# Patient Record
Sex: Male | Born: 1935 | Race: Black or African American | Hispanic: No | Marital: Married | Smoking: Current every day smoker
Health system: Southern US, Community
[De-identification: ages and names within clinical notes are randomized; demographics above are authoritative.]

## PROBLEM LIST (undated history)

## (undated) DIAGNOSIS — K602 Anal fissure, unspecified: Secondary | ICD-10-CM

## (undated) DIAGNOSIS — I10 Essential (primary) hypertension: Secondary | ICD-10-CM

## (undated) DIAGNOSIS — A048 Other specified bacterial intestinal infections: Secondary | ICD-10-CM

## (undated) DIAGNOSIS — F17200 Nicotine dependence, unspecified, uncomplicated: Secondary | ICD-10-CM

## (undated) DIAGNOSIS — N12 Tubulo-interstitial nephritis, not specified as acute or chronic: Secondary | ICD-10-CM

## (undated) DIAGNOSIS — N400 Enlarged prostate without lower urinary tract symptoms: Secondary | ICD-10-CM

## (undated) DIAGNOSIS — C679 Malignant neoplasm of bladder, unspecified: Secondary | ICD-10-CM

## (undated) DIAGNOSIS — C61 Malignant neoplasm of prostate: Secondary | ICD-10-CM

## (undated) DIAGNOSIS — K253 Acute gastric ulcer without hemorrhage or perforation: Secondary | ICD-10-CM

## (undated) DIAGNOSIS — K429 Umbilical hernia without obstruction or gangrene: Secondary | ICD-10-CM

## (undated) HISTORY — DX: Umbilical hernia without obstruction or gangrene: K42.9

## (undated) HISTORY — PX: BLADDER NECK RECONSTRUCTION: SHX1239

## (undated) HISTORY — DX: Tubulo-interstitial nephritis, not specified as acute or chronic: N12

## (undated) HISTORY — DX: Acute gastric ulcer without hemorrhage or perforation: K25.3

## (undated) HISTORY — DX: Anal fissure, unspecified: K60.2

## (undated) HISTORY — DX: Nicotine dependence, unspecified, uncomplicated: F17.200

## (undated) HISTORY — PX: PROSTATE SURGERY: SHX751

## (undated) HISTORY — DX: Other specified bacterial intestinal infections: A04.8

## (undated) HISTORY — DX: Essential (primary) hypertension: I10

## (undated) HISTORY — DX: Benign prostatic hyperplasia without lower urinary tract symptoms: N40.0

## (undated) HISTORY — PX: URETHRAL DILATION: SUR417

---

## 2008-10-27 ENCOUNTER — Emergency Department (HOSPITAL_COMMUNITY): Admission: EM | Admit: 2008-10-27 | Discharge: 2008-10-27 | Payer: Self-pay | Admitting: Emergency Medicine

## 2009-02-15 ENCOUNTER — Emergency Department (HOSPITAL_COMMUNITY): Admission: EM | Admit: 2009-02-15 | Discharge: 2009-02-15 | Payer: Self-pay | Admitting: Family Medicine

## 2009-04-16 ENCOUNTER — Emergency Department (HOSPITAL_COMMUNITY): Admission: EM | Admit: 2009-04-16 | Discharge: 2009-04-16 | Payer: Self-pay | Admitting: Family Medicine

## 2009-05-21 ENCOUNTER — Encounter: Payer: Self-pay | Admitting: Family Medicine

## 2009-05-21 ENCOUNTER — Ambulatory Visit: Payer: Self-pay | Admitting: Family Medicine

## 2009-05-21 DIAGNOSIS — K253 Acute gastric ulcer without hemorrhage or perforation: Secondary | ICD-10-CM

## 2009-05-21 DIAGNOSIS — I10 Essential (primary) hypertension: Secondary | ICD-10-CM

## 2009-05-21 DIAGNOSIS — F17200 Nicotine dependence, unspecified, uncomplicated: Secondary | ICD-10-CM

## 2009-05-21 HISTORY — DX: Essential (primary) hypertension: I10

## 2009-05-21 HISTORY — DX: Acute gastric ulcer without hemorrhage or perforation: K25.3

## 2009-05-21 HISTORY — DX: Nicotine dependence, unspecified, uncomplicated: F17.200

## 2009-05-21 LAB — CONVERTED CEMR LAB
BUN: 25 mg/dL — ABNORMAL HIGH (ref 6–23)
CO2: 27 meq/L (ref 19–32)
Chloride: 100 meq/L (ref 96–112)
Creatinine, Ser: 1.31 mg/dL (ref 0.40–1.50)
H Pylori IgG: POSITIVE
HCT: 46.5 % (ref 39.0–52.0)
HDL: 26 mg/dL — ABNORMAL LOW (ref >39)
Hemoglobin: 16.3 g/dL (ref 13.0–17.0)
LDL Cholesterol: 66 mg/dL (ref 0–99)
MCV: 94.5 fL (ref 78.0–100.0)
Potassium: 3.5 meq/L (ref 3.5–5.3)
RBC: 4.92 M/uL (ref 4.22–5.81)
RDW: 13.1 % (ref 11.5–15.5)
Total CHOL/HDL Ratio: 5.3
VLDL: 45 mg/dL — ABNORMAL HIGH (ref 0–40)
WBC: 5.5 10*3/uL (ref 4.0–10.5)

## 2009-06-01 ENCOUNTER — Encounter: Payer: Self-pay | Admitting: Family Medicine

## 2009-06-04 ENCOUNTER — Encounter (HOSPITAL_COMMUNITY): Admission: RE | Admit: 2009-06-04 | Discharge: 2009-08-23 | Payer: Self-pay

## 2009-06-05 ENCOUNTER — Emergency Department (HOSPITAL_COMMUNITY): Admission: EM | Admit: 2009-06-05 | Discharge: 2009-06-05 | Payer: Self-pay | Admitting: Emergency Medicine

## 2009-06-06 ENCOUNTER — Emergency Department (HOSPITAL_COMMUNITY): Admission: EM | Admit: 2009-06-06 | Discharge: 2009-06-06 | Payer: Self-pay | Admitting: Emergency Medicine

## 2009-06-10 ENCOUNTER — Ambulatory Visit: Payer: Self-pay | Admitting: Family Medicine

## 2009-06-10 ENCOUNTER — Ambulatory Visit (HOSPITAL_COMMUNITY): Admission: RE | Admit: 2009-06-10 | Discharge: 2009-06-10 | Payer: Self-pay | Admitting: Family Medicine

## 2009-06-10 DIAGNOSIS — R1013 Epigastric pain: Secondary | ICD-10-CM

## 2009-06-10 DIAGNOSIS — R1310 Dysphagia, unspecified: Secondary | ICD-10-CM | POA: Insufficient documentation

## 2009-06-14 ENCOUNTER — Ambulatory Visit: Payer: Self-pay | Admitting: Family Medicine

## 2009-06-14 ENCOUNTER — Encounter: Payer: Self-pay | Admitting: Family Medicine

## 2009-06-14 DIAGNOSIS — A048 Other specified bacterial intestinal infections: Secondary | ICD-10-CM | POA: Insufficient documentation

## 2009-06-14 HISTORY — DX: Other specified bacterial intestinal infections: A04.8

## 2009-07-02 ENCOUNTER — Ambulatory Visit: Payer: Self-pay | Admitting: Family Medicine

## 2009-11-30 ENCOUNTER — Encounter: Payer: Self-pay | Admitting: Family Medicine

## 2009-11-30 ENCOUNTER — Ambulatory Visit: Payer: Self-pay | Admitting: Family Medicine

## 2009-11-30 LAB — CONVERTED CEMR LAB
AST: 17 units/L (ref 0–37)
Alkaline Phosphatase: 61 units/L (ref 39–117)
Basophils Absolute: 0 10*3/uL (ref 0.0–0.1)
Basophils Relative: 0 % (ref 0–1)
CO2: 24 meq/L (ref 19–32)
Eosinophils Absolute: 0.1 10*3/uL (ref 0.0–0.7)
Eosinophils Relative: 3 % (ref 0–5)
Glucose, Bld: 112 mg/dL — ABNORMAL HIGH (ref 70–99)
Glucose, Urine, Semiquant: NEGATIVE
Hemoglobin: 16.1 g/dL (ref 13.0–17.0)
Lymphocytes Relative: 47 % — ABNORMAL HIGH (ref 12–46)
Lymphs Abs: 2.1 10*3/uL (ref 0.7–4.0)
Monocytes Absolute: 0.5 10*3/uL (ref 0.1–1.0)
Monocytes Relative: 11 % (ref 3–12)
Neutrophils Relative %: 39 % — ABNORMAL LOW (ref 43–77)
Protein, U semiquant: 30
RDW: 13.5 % (ref 11.5–15.5)
Specific Gravity, Urine: 1.025
Total Protein: 7.2 g/dL (ref 6.0–8.3)

## 2009-12-01 ENCOUNTER — Ambulatory Visit (HOSPITAL_COMMUNITY): Admission: RE | Admit: 2009-12-01 | Discharge: 2009-12-01 | Payer: Self-pay | Admitting: Family Medicine

## 2009-12-21 ENCOUNTER — Ambulatory Visit: Payer: Self-pay | Admitting: Family Medicine

## 2009-12-21 DIAGNOSIS — N400 Enlarged prostate without lower urinary tract symptoms: Secondary | ICD-10-CM

## 2009-12-21 DIAGNOSIS — H538 Other visual disturbances: Secondary | ICD-10-CM

## 2009-12-21 HISTORY — DX: Benign prostatic hyperplasia without lower urinary tract symptoms: N40.0

## 2010-03-29 ENCOUNTER — Encounter: Payer: Self-pay | Admitting: Family Medicine

## 2010-06-14 NOTE — Assessment & Plan Note (Signed)
Summary: f/up,tcb   Vital Signs:  Patient profile:   77 year old male Weight:      181.7 pounds Temp:     98.5 degrees F oral Pulse rate:   68 / minute BP sitting:   118 / 71  (left arm) Cuff size:   regular  Vitals Entered By: Garen Grams LPN (December 21, 2009 4:05 PM) CC: f/u last visit Is Patient Diabetic? No Pain Assessment Patient in pain? no        Primary Care Provider:  Antoine Primas  CC:  f/u last visit.  History of Present Illness: 77 yo male with hx of BPH s/p turp here for f/u for recent UTI.  Pt was seen recently at [facility] where he had some type of dilatation for his urethra.  Pt was placed on macrobid and finished a course approx a week ago. Pt denies any pain with urination no increase frequency or urgency.  Pt feels much more like himself.  Pt did not get the cardura because it was too expensive.   Pt does report some constipation recently, but not an acute change pt did try colace but only took it for 1-2 days and did not feel it made a difference. No change in stool color. no change in frequency of consistency, just something that could be improved.   blurred visoin-  Pt has hx of cataract surgery of left eye, blind right eye. Pt staes recently been having more prblem with seeing, has glasses but even with glasees vision is not as clear as it used to be. No loss of vision, color is still the same. Pt has not had an eye exam for quite some time.    Still smoking does not want to quit.  Pt recently had a CT scan of the lungs as well and found a nodule approx 5mm that will need to be followed with CT scan in 6-12 months.     Habits & Providers  Alcohol-Tobacco-Diet     Tobacco Status: never  Allergies: No Known Drug Allergies  Social History: Smoking Status:  never   Impression & Recommendations:  Problem # 1:  DYSURIA (ICD-788.1) Assessment Improved resolved.   Did not test for cure, will not do ppx treatment at this time but would not hesitate  treating with macrobid again with likely ppx treatment at that time. Will continue to be followed by [facility].  WOuld consider getting PSA next visit.  The following medications were removed from the medication list:    Nitrofurantoin Macrocrystal 100 Mg Caps (Nitrofurantoin macrocrystal) .Marland Kitchen... 1 capsule by mouth 4 times a day for 14 days.  take with food.  Problem # 2:  BLURRED VISION (ICD-368.8) will refer to optho.  Looks like reaccumulation of pt cataracts.  Pt had cataract surgery previously. Pt though has decent sight in that eye.  Blind in right eye.  Will check pt blood sugar at next visit.  Orders: [facility]- Est  Level 4 (40981) Ophthalmology Referral (Ophthalmology)  Problem # 3:  TOBACCO ABUSE (ICD-305.1) Pt still smoking, does have a nodule in lung that needs tobe monitored, due for repaet CT scan after 1/12.  Pt once again knows of consequence of continuing smoking.  Still declines help.  Orders: [facility]- Est  Level 4 (19147)  Problem # 4:  COnstipation WIll try colace and miralax, monitor for improvement.   Complete Medication List: 1)  Hydrochlorothiazide 25 Mg Tabs (Hydrochlorothiazide) .... Take 1 tab daily 2)  Colace 100  Mg Caps (Docusate sodium) .... Take 1 cap by mouth two times a day for constipation 3)  Miralax Powd (Polyethylene glycol 3350) .... Take 1 capful by mouth two times a day 4)  Doxazosin Mesylate 2 Mg Tabs (Doxazosin mesylate) .... Take 1 tab by mouth once daily  Patient Instructions: 1)  good to see you 2)  I am giving you a prescription for the medicine that should help his prostate.  Take 1 pill daily.  Go to any [facility] and can get a 3 month supply for 10 dollars 3)  Try miralax and colace for constipation.  Take 1-2 times daily hold for loose stool 4)  If you begin to get fever, pain with urination or increse frequency or urgency you need to be seen again.  5)  I want to see you again in 4 weeks. Prescriptions: DOXAZOSIN MESYLATE 2 MG TABS (DOXAZOSIN MESYLATE)  take 1 tab by mouth once daily  #90 x 3   Entered and Authorized by:   Antoine Primas DO   Signed by:   Antoine Primas DO on 12/21/2009   Method used:   Print then Give to Patient   RxID:   [id number] MIRALAX  POWD (POLYETHYLENE GLYCOL 3350) take 1 capful by mouth two times a day  #1 bottle x 3   Entered and Authorized by:   Antoine Primas DO   Signed by:   Antoine Primas DO on 12/21/2009   Method used:   Faxed to ...       [facility] (retail)       [address] [place], [place]  [address]       Ph: [phone]       Fax: [phone]   RxID:   [id number] COLACE 100 MG CAPS (DOCUSATE SODIUM) take 1 cap by mouth two times a day for constipation  #60 x 3   Entered and Authorized by:   Antoine Primas DO   Signed by:   Antoine Primas DO on 12/21/2009   Method used:   Faxed to ...       [facility] Department (retail)       [address] [place], [place]  [address]       Ph: [phone]       Fax: [phone]   RxID:   [id number]

## 2010-06-14 NOTE — Miscellaneous (Signed)
  Clinical Lists Changes  Problems: Removed problem of DYSURIA (ICD-788.1) Removed problem of FREQUENCY, URINARY (ICD-788.41) Removed problem of ABDOMINAL PAIN (ICD-789.00)

## 2010-06-14 NOTE — Assessment & Plan Note (Signed)
Summary: CHEST  PAIN TROUBLE SWALLOWING/LS   Vital Signs:  Patient profile:   77 year old male Weight:      140 pounds Pulse rate:   80 / minute BP sitting:   140 / 80  (left arm)  Vitals Entered By: Theresia Lo RN (June 10, 2009 1:52 PM) CC: CHESTB PAIN, TROUBLE SWALLOWING Is Patient Diabetic? No   Primary Care Provider:  Antoine Primas  CC:  CHESTB PAIN and TROUBLE SWALLOWING.  History of Present Illness: work in visit for epigastric pain and trouble swallowing x 3 days.  Recently (1 week ago) d/c'd from [facility] for a prostate surgery.  He had been in the hospital for three days and still had a foley catheter in place.  3 days ago, pt started to have sharp and intermittent epigastric pain.  This pain does not radiate.  He has some pain in his lower back that followed the same timecourse.  He denies any feeling of choaking or coughing with liquids.  He also began to have pain with drinking liquids and felt that he had trouble with swallowing solids.  For the last two days, he has not had an appetite and has not been eating much.    He denies fever or weightloss.  no vomitting.  He has been diagnosed with GERD and was started on a PPI.  However, he is not sure if he recieved this medicine while in the hospital.  He has not tried any medication for this pain.    Habits & Providers  Alcohol-Tobacco-Diet     Tobacco Status: current     Tobacco Counseling: to quit use of tobacco products     Cigarette Packs/Day: 2.0  Current Medications (verified): 1)  Hydrochlorothiazide 25 Mg Tabs (Hydrochlorothiazide) .... Take 1 Tab Daily 2)  Nicorette 4 Mg Gum (Nicotine Polacrilex) .... Chew One Piece of Gum At A Time.  Up To 10 Pieces Daily For First 6 Weeks 3)  Omeprazole 40 Mg Cpdr (Omeprazole) .... Take 1 Pill Two Times A Day  Allergies (verified): No Known Drug Allergies  Social History: Smoking Status:  current Packs/Day:  2.0  Review of Systems       The patient complains of  anorexia, prolonged cough, and abdominal pain.  The patient denies fever, weight loss, chest pain, melena, and hematochezia.    Physical Exam  General:  Well-developed,well-nourished,in no acute distress; alert,appropriate and cooperative throughout examination Mouth:  fair dentitionpharynx pink and moist and no lesions.   Neck:  supple, full ROM, no thyromegaly, no thyroid nodules or tenderness, and no neck tenderness.   Lungs:  Normal respiratory effort, chest expands symmetrically. Lungs are clear to auscultation, no crackles or wheezes. Heart:  Normal rate and regular rhythm. S1 and S2 normal without gallop, murmur, click, rub or other extra sounds. Abdomen:  no distention, no masses, no guarding, abdominal scar(s), bowel sounds hyperactive, and epigastric tenderness.  and periumbilical tenderness Rectal:  No external abnormalities noted. Normal sphincter tone. No rectal masses or tenderness.  stool negative for occult blood Cervical Nodes:  no nodes appreciated   Impression & Recommendations:  Problem # 1:  EPIGASTRIC PAIN (ICD-789.06) Assessment New originally reported as chest pain.   Got EKG, which I reviewed with Dr. McDiarmid, and was not concerned for acute MI.  With further hx, appears to be epigastric not chest.  Sounds like possibly ulcer or gastritis.  Has hx of GERD. negative FOB.   Felt some relief with GI cocktail.  Recommended that pt take prilosec 40 mg two times a day instead of once daily.  Told pt to follow up on Monday.  WIll consider GI referral for EGD if unrelieved by increased PPI.   Orders: [facility]- Est  Level 4 ([address])  Problem # 2:  DYSPHAGIA UNSPECIFIED (ICD-787.20) Assessment: New trouble and pain with liquids and solids. possibly esphagitis vs. stricture.  stricture less likely with this sudden onset.   Plan as above.  recommended Pt take ensure while uncomfortable with swallowing. Orders: [facility]- Est  Level 4 (60454)  Complete Medication List: 1)   Hydrochlorothiazide 25 Mg Tabs (Hydrochlorothiazide) .... Take 1 tab daily 2)  Nicorette 4 Mg Gum (Nicotine polacrilex) .... Chew one piece of gum at a time.  up to 10 pieces daily for first 6 weeks 3)  Omeprazole 40 Mg Cpdr (Omeprazole) .... Take 1 pill two times a day  Other Orders: EMR miscellaneous medications [facility])  Patient Instructions: 1)  please come back on Monday.  Make an appt at the desk 2)  I want you to take your acid reducing medicine twice a day.   3)  If you aren't eating, please drink ensure to get some calories until you feel better.   4)  Please call the clinic if you have blood in your stool, if you are completely unable to swallow, or with any concerns.  Prescriptions: OMEPRAZOLE 40 MG CPDR (OMEPRAZOLE) Take 1 pill two times a day  #60 x 2   Entered and Authorized by:   Ellery Plunk MD   Signed by:   Ellery Plunk MD on 06/10/2009   Method used:   Print then Give to Patient   RxID:   [id number]    Vital Signs:  Patient profile:   77 year old male Weight:      140 pounds Pulse rate:   80 / minute BP sitting:   140 / 80  (left arm)  Vitals Entered By: Theresia Lo RN (June 10, 2009 1:52 PM)   Medication Administration  Medication # 1:    Medication: EMR miscellaneous medications    Diagnosis: ACUT GASTR ULCER W/O MENTION HEMORR PERF/OBST (ICD-531.30)    Dose: 30ml    Route: po    Exp Date: 11/01/2009    Lot #: [id number]    Comments: GI cocktail    Patient tolerated medication without complications    Given by: Jone Baseman CMA (June 10, 2009 2:47 PM)  Orders Added: 1)  EMR miscellaneous medications [EMRORAL] 2)  [facility]- Est  Level 4 [69629]

## 2010-06-14 NOTE — Assessment & Plan Note (Signed)
Summary: fu/kh   Vital Signs:  Patient profile:   77 year old male Weight:      183.5 pounds Temp:     99.1 degrees F oral Pulse rate:   103 / minute BP sitting:   127 / 74  (right arm) Cuff size:   regular  Vitals Entered By: Garen Grams LPN (July 02, 2009 2:19 PM) CC: f/u , Hypertension Management Is Patient Diabetic? No Pain Assessment Patient in pain? yes     Location: back   Primary Care Provider:  Antoine Primas  CC:  f/u  and Hypertension Management.  History of Present Illness: Pt state ejaculation problem since surgery.  Pt orgasms but no ejaculation occurs.  Pt states no pain no blood just wants to know whats going on.  Pt informed in the operation note it states that he has to have a vasectomy   HTN well control  Pt is taking his medications well and is very happy with his regimen.   tobacco abuse.  Pt states he is still smoking tried the gum but could not stop.  Not motivated to try to stop at this moment.    Pt was treated for H pylori recently and it helped.  Pt states he has not felt this good in a long time.   Hypertension History:      Positive major cardiovascular risk factors include male age 59 years old or older, hypertension, and current tobacco user.    Current Medications (verified): 1)  Hydrochlorothiazide 25 Mg Tabs (Hydrochlorothiazide) .... Take 1 Tab Daily 2)  Nicorette 4 Mg Gum (Nicotine Polacrilex) .... Chew One Piece of Gum At A Time.  Up To 10 Pieces Daily For First 6 Weeks 3)  Omeprazole 40 Mg Cpdr (Omeprazole) .... Take 1 Pill Two Times A Day  Allergies (verified): No Known Drug Allergies  Past History:  Past Surgical History: proctectomy and bladder repair surgery > 2 years ago.  Reconstructed at [facility] in 05/2009  Review of Systems       denies fever, chills, nausea, vomiting, diarrhea or constipation   Physical Exam  General:  Well-developed,well-nourished,in no acute distress; alert,appropriate and cooperative throughout  examination Lungs:  Normal respiratory effort, chest expands symmetrically. Lungs are clear to auscultation, no crackles or wheezes. Heart:  Normal rate and regular rhythm. S1 and S2 normal without gallop, murmur, click, rub or other extra sounds. Abdomen:  Bowel sounds positive,abdomen soft and non-tender without masses, organomegaly or hernias noted.   Impression & Recommendations:  Problem # 1:  ESSENTIAL HYPERTENSION, BENIGN (ICD-401.1) Assessment Improved well controlled, continue current tx His updated medication list for this problem includes:    Hydrochlorothiazide 25 Mg Tabs (Hydrochlorothiazide) .Marland Kitchen... Take 1 tab daily  Orders: [facility]- Est  Level 4 (36644)  Problem # 2:  DYSPHAGIA UNSPECIFIED (ICD-787.20) tx for H pylori and no longer have any problems  Problem # 3:  TOBACCO ABUSE (ICD-305.1) Assessment: Unchanged not motivated to quit at this time His updated medication list for this problem includes:    Nicorette 4 Mg Gum (Nicotine polacrilex) .Marland Kitchen... Chew one piece of gum at a time.  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 Take 1 tab daily 2)  Nicorette 4 Mg Gum (Nicotine polacrilex) .... Chew one piece of gum at a time.  up to 10 pieces daily for first 6 weeks 3)  Omeprazole 40 Mg Cpdr (Omeprazole) .... Take 1 pill two times a day  Hypertension Assessment/Plan:      The patient's hypertensive risk group is category B: At least one risk factor (excluding diabetes) with no target organ damage.  His calculated 10 year risk of coronary heart disease is 22 %.  Today's blood pressure is 127/74.  His blood pressure goal is < 140/90.   Prevention & Chronic Care Immunizations   Influenza vaccine: Not documented   Influenza vaccine deferral: Deferred  (07/02/2009)    Tetanus booster:  Not documented   Td booster deferral: Not indicated  (07/02/2009)    Pneumococcal vaccine: Not documented    H. zoster vaccine: Not documented  Colorectal Screening   Hemoccult: Not documented    Colonoscopy: Not documented  Other Screening   PSA: Not documented   Smoking status: current  (06/14/2009)  Lipids   Total Cholesterol: 137  (05/21/2009)   LDL: 66  (05/21/2009)   LDL Direct: Not documented   HDL: 26  (05/21/2009)   Triglycerides: 223  (05/21/2009)  Hypertension   Last Blood Pressure: 127 / 74  (07/02/2009)   Serum creatinine: 1.31  (05/21/2009)   Serum potassium 3.5  (05/21/2009)    Hypertension flowsheet reviewed?: Yes   Progress toward BP goal: At goal  Self-Management Support :   Personal Goals (by the next clinic visit) :      Personal blood pressure goal: 140/90  (07/02/2009)   Hypertension self-management support: Written self-care plan  (07/02/2009)   Hypertension self-care plan printed.

## 2010-06-14 NOTE — Assessment & Plan Note (Signed)
Summary: f/u per smith/eo   Vital Signs:  Patient profile:   77 year old male Weight:      188 pounds Temp:     98.4 degrees F Pulse rate:   76 / minute BP sitting:   150 / 76  Vitals Entered By: Jone Baseman CMA (June 14, 2009 1:43 PM) CC: f/u chest discomfort Is Patient Diabetic? No Pain Assessment Patient in pain? no        Primary Care Provider:  Antoine Primas  CC:  f/u chest discomfort.  History of Present Illness: Evaluated for epigastric pain on 01/27 in this clinic.  Relief from GI cocktail, so given Rx for Omeprazole 40 mg two times a day which he says has provided complete relief.  Has h/o peptic ulcer without bleeding.  H Pylori positive.  Also questions why blood pressure is elevated today.  Today denies epigastric and chest pain, nausea, vomiting, diarrhea, constipation, hematochezia, melena, dyspnea.  Habits & Providers  Alcohol-Tobacco-Diet     Tobacco Status: current     Tobacco Counseling: to quit use of tobacco products     Cigarette Packs/Day: 1.0  Allergies: No Known Drug Allergies  Social History: Packs/Day:  1.0  Physical Exam  General:  Well-developed,well-nourished,in no acute distress; alert,appropriate and cooperative throughout examination Lungs:  Normal respiratory effort, chest expands symmetrically. Lungs are clear to auscultation, no crackles or wheezes. Heart:  Normal rate and regular rhythm. S1 and S2 normal without gallop, murmur, click, rub or other extra sounds. Abdomen:  Bowel sounds positive,abdomen soft and non-tender without masses, organomegaly or hernias noted.   Impression & Recommendations:  Problem # 1:  EPIGASTRIC PAIN (ICD-789.06) Assessment Improved  Likely GERD.  Continue PPI two times a day.  Orders: [facility]- Est  Level 4 ([address]109)  Problem # 2:  HELICOBACTER PYLORI INFECTION (ICD-041.86) Assessment: New  Treat with continued PPI + Amoxicillin & Clarithromycin two times a day x2 weeks.  Orders: [facility]-  Est  Level 4 (60454)  Problem # 3:  ESSENTIAL HYPERTENSION, BENIGN (ICD-401.1) Assessment: Deteriorated  Will not adjust meds in light of recent surgery.  If elevated in 1 month, would adjust medications. His updated medication list for this problem includes:    Hydrochlorothiazide 25 Mg Tabs (Hydrochlorothiazide) .Marland Kitchen... Take 1 tab daily  BP today: 150/76 Prior BP: 140/80 (06/10/2009)  Labs Reviewed: K+: 3.5 (05/21/2009) Creat: : 1.31 (05/21/2009)   Chol: 137 (05/21/2009)   HDL: 26 (05/21/2009)   LDL: 66 (05/21/2009)   TG: 223 (05/21/2009)  Orders: [facility]- Est  Level 4 (09811)  Complete Medication List: 1)  Hydrochlorothiazide 25 Mg Tabs (Hydrochlorothiazide) .... Take 1 tab daily 2)  Nicorette 4 Mg Gum (Nicotine polacrilex) .... Chew one piece of gum at a time.  up to 10 pieces daily for first 6 weeks 3)  Omeprazole 40 Mg Cpdr (Omeprazole) .... Take 1 pill two times a day 4)  Clarithromycin 500 Mg Tabs (Clarithromycin) .Marland Kitchen.. 1 tab by mouth two times a day x14 days 5)  Amoxicillin 500 Mg Tabs (Amoxicillin) .... 2 tabs by mouth two times a day x2 weeks  Patient Instructions: 1)  Continue to use the Omeprazole two times a day for your pain. 2)  It may be caused by an infection in your stomach call H Pylori, so I want to give you 2 weeks of antiboitics to take two times a day:  Amoxicillin and Clarithromycin. 3)  Please schedule a follow-up appointment in 1 month with Dr. Katrinka Blazing. Prescriptions: OMEPRAZOLE 40  MG CPDR (OMEPRAZOLE) Take 1 pill two times a day  #60 x 2   Entered and Authorized by:   Romero Belling MD   Signed by:   Romero Belling MD on 06/14/2009   Method used:   Print then Give to Patient   RxID:   [id number] AMOXICILLIN 500 MG TABS (AMOXICILLIN) 2 tabs by mouth two times a day x2 weeks  #28 x 0   Entered and Authorized by:   Romero Belling MD   Signed by:   Romero Belling MD on 06/14/2009   Method used:   Print then Give to Patient   RxID:    [id number] CLARITHROMYCIN 500 MG TABS (CLARITHROMYCIN) 1 tab by mouth two times a day x14 days  #28 x 0   Entered and Authorized by:   Romero Belling MD   Signed by:   Romero Belling MD on 06/14/2009   Method used:   Print then Give to Patient   RxID:   [id number]

## 2010-06-14 NOTE — Miscellaneous (Signed)
Summary: being d/c from [facility]  Clinical Lists Changes spoke with  Olegario Messier at [facility]. [phone] was in for urethral dilataion & other precedures. he is being discharged on IV antibiotics to be done once dailt. they want to set this up to be done in [place]. told her that the md would likely set it up to be done at Short Stay or thru [facility].  last dose would be Sunday 23rd. she is faxing all notes & orders. PCP is not here so will give to preceptor to handle.Golden Circle RN  June 01, 2009 12:07 PM  rec'd the [facility] notes & orders. to preceptor to handle. spoke with SW. he has no insurance.she had not set him up with home care for the daily infusion because then her hospital will have to pay for it. told her he has no insurance coverage here.  she will start working on arranging home care.  she will see that he gets a [facility] & the home care agency gets the orders. will need md here to oversee his care & get results of labs, etc. told her he does have a pcp here.Golden Circle RN  June 01, 2009 4:03 PM

## 2010-06-14 NOTE — Assessment & Plan Note (Signed)
Summary: NEW PT/KH   Primary Care Provider:  Antoine Primas   History of Present Illness: 77 yo new pt recent immagrant of [place] here to establish care with multiple problems.  1.) Dysuria-  Pt is seen by urologist at [facility], Pt has hx of prostectomy which caused a nick in the bladder that needed to be repaired.  Pt now has had dysuuria, increase frequency, foul smelling urine.  Pt was recently put on cipro for UTI and is f/u with [facility] for procedure to help the chronic UTI's  2.)  tobacco abuse.  Pt smoke 2ppd and for multiple decades.  Pt would like to stop.  Has not tried to quit before but he would like to quit for his family  3.)Ulcer.  Pt had a UES done in [place] that diagnosed, never bled though.  Pt states he still has stomach pain off and on but it is better.  He takes zantac which seems to help some.    Never was tested for H pylori.   4. HTN- Has had it many years is on HCTZ.  123/78 today.  Denies headache, visual changes abdominal pain out of the ordinary.    5.) Catarcts.  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  DOes see an optholmologist who states the left eye is still ok and does not need surgery yet.  Pt will f/u with optho.    Current Medications (verified): 1)  Hydrochlorothiazide 25 Mg Tabs (Hydrochlorothiazide) .... Take 1 Tab Daily 2)  Nicorette 4 Mg Gum (Nicotine Polacrilex) .... Chew One Piece of Gum At A Time.  Up To 10 Pieces Daily For First 6 Weeks 3)  Omeprazole 40 Mg Cpdr (Omeprazole) .... Take 1 Pill Daily  Allergies (verified): No Known Drug Allergies  Past History:  Past Medical History: HTN, Ulcer,   Past Surgical History: proctectomy and bladder repair surgery > 2 years ago.    Family History: mother HTN  Social History: Lives with son and 6 grand kids and his wife who also recently immagrated from [place].    Review of Systems       denies fever, chills, nausea, vomiting, diarrhea or constipation cp, sob  Physical Exam  General:  dressed in  traditional arabic clothing, NAD, very friendly.   Head:  Normocephalic and atraumatic without obvious abnormalities. No apparent alopecia or balding. Eyes:  Pt has severe catarct in the gith eye, can see only shadows left eye has mild to mmoderate cataract, errla, eomi Mouth:  multiple filling otherwise in good repair Neck:  No deformities, masses, or tenderness noted. Was told one time that he had a swollen node on the right side of his neck.  Not there now.) Lungs:  decreased airmovement throughout, mild rhonchi, no weezes, still good aeration.    Heart:  Normal rate and regular rhythm. S1 and S2 normal without gallop, murmur, click, rub or other extra sounds. Abdomen:  pt has a surgical scar.  Bs+, NT ND Pulses:  R and L carotid,radial,femoral,dorsalis pedis and posterior tibial pulses are full and equal bilaterally Extremities:  No clubbing, cyanosis, edema, or deformity noted with normal full range of motion of all joints.     Impression & Recommendations:  Problem # 1:  ACUT GASTR ULCER W/O MENTION HEMORR PERF/OBST (ICD-531.30) Assessment Unchanged  Orders: CBC-[facility] (16109) H pylori-[facility] (60454)  His updated medication list for this problem includes:    Omeprazole 40 Mg Cpdr (Omeprazole) .Marland Kitchen... Take 1 pill daily  Problem # 2:  DYSURIA (ICD-788.1) Assessment: Unchanged Will follow up with [facility] urology on 05/24/09.  Likely will have surgery  Problem # 3:  ESSENTIAL HYPERTENSION, BENIGN (ICD-401.1) Assessment: Unchanged controlled Orders: Basic Met-[facility] (16010-93235) Lipid-[facility] (57322-02542)  His updated medication list for this problem includes:    Hydrochlorothiazide 25 Mg Tabs (Hydrochlorothiazide) .Marland Kitchen... Take 1 tab daily  Problem # 4:  TOBACCO ABUSE (ICD-305.1) Assessment: Unchanged  His updated medication list for this problem includes:    Nicorette 4 Mg Gum (Nicotine polacrilex) .Marland Kitchen... Chew one piece of gum at a time.  up to 10 pieces daily for first 6 weeks  Complete  Medication List: 1)  Hydrochlorothiazide 25 Mg Tabs (Hydrochlorothiazide) .... Take 1 tab daily 2)  Nicorette 4 Mg Gum (Nicotine polacrilex) .... Chew one piece of gum at a time.  up to 10 pieces daily for first 6 weeks 3)  Omeprazole 40 Mg Cpdr (Omeprazole) .... Take 1 pill daily  Patient Instructions: 1)  Nice to meet you  2)  I am giving you 3 medications  3)      1.  HCTZ 25mg  Take 1 pill daily 4)      2.  Omeproazole 40mg  1 pill daily 5)      3.  nicorette gum-  can chew up to ten pieces daily, one at a time, in one day 6)  I also want to get some labs today 7)  I would like to see you again in 4-6 weeks for follow up.   8)    Prescriptions: OMEPRAZOLE 40 MG CPDR (OMEPRAZOLE) Take 1 pill daily  #32 x 11   Entered and Authorized by:   Antoine Primas DO   Signed by:   Antoine Primas DO on 05/21/2009   Method used:   Faxed to ...       [facility] Department (retail)       [address] [place], [place]  [address]       Ph: [phone]       Fax: [phone]   RxID:   [id number] NICORETTE 4 MG GUM (NICOTINE POLACRILEX) chew one piece of gum at a time.  Up to 10 pieces daily for first 6 weeks  #1 month supp x 10   Entered and Authorized by:   Antoine Primas DO   Signed by:   Antoine Primas DO on 05/21/2009   Method used:   Faxed to ...       [facility] Department (retail)       [address] [place], [place]  [address]       Ph: [phone]       Fax: [phone]   RxID:   [id number] HYDROCHLOROTHIAZIDE 25 MG TABS (HYDROCHLOROTHIAZIDE) Take 1 tab daily  #32 x 11   Entered and Authorized by:   Antoine Primas DO   Signed by:   Antoine Primas DO on 05/21/2009   Method used:   Faxed to ...       [facility] Department (retail)       [address] [place], [place]  [address]       Ph: [phone]       Fax: [phone]   RxID:   [id number]   Laboratory Results   Blood Tests   Date/Time Received: May 21, 2009 4:52 PM  Date/Time Reported: May 21, 2009 5:08 PM  H. pylori: positive Comments: ...........test performed by...........Marland KitchenTerese Door, CMA

## 2010-06-14 NOTE — Assessment & Plan Note (Signed)
Summary: pain in bladder,tcb  spoke with keri in radiology set up ct for 7/20 @ 8am  Vital Signs:  Patient profile:   77 year old male Weight:      180 pounds Temp:     98.3 degrees F Pulse rate:   88 / minute BP sitting:   134 / 80  Vitals Entered By: Jone Baseman CMA (November 30, 2009 2:17 PM) CC: abd pain Is Patient Diabetic? No Pain Assessment Patient in pain? no        Primary Care Provider:  Antoine Primas  CC:  abd pain.  History of Present Illness: 77 y/o M with  Lower abd pain x 2 wks.  Pt's son is present and is interpreting for pt.   ABDOMINAL PAIN Location: bilateral lower quadrant, pain is intermittent, lasting minutes, occurs without pattern.  No relieving or aggravating symptoms.   Onset: 2 weeks Description: feels like "pulling sensation" Modifying factors:  son states that prostectomy was secondary to enlarged prostate and not cancer.     Symptoms Nausea/Vomiting: no Diarrhea: no Constipation: yes, usually has BM daily, but for 3 days now has not had BM until today.  today Bm was normal.   Melena/BRBPR: no Hematemesis: no Anorexia: no Fever/Chills: no Jaundice: no Dysuria: no Back pain: yes, long chronic lower back pain Rash: no  Weight loss: no STD exposure: no Alcohol use: no NSAID use: last use over 1 year ago.  Urethral discharge: no  PMH: HTN, ulcer Past Surgeries: Prostectomy 3 yrs (12/2006) for BPH  + Tobacco use: at least 1ppd > 50 yrs.     Habits & Providers  Alcohol-Tobacco-Diet     Tobacco Status: current     Tobacco Counseling: to quit use of tobacco products     Cigarette Packs/Day: 1.0  Current Medications (verified): 1)  Hydrochlorothiazide 25 Mg Tabs (Hydrochlorothiazide) .... Take 1 Tab Daily  Allergies (verified): No Known Drug Allergies  Past History:  Family History: Last updated: 05/21/2009 mother HTN  Social History: Last updated: 11/30/2009 Lives with son and 6 grand kids and his wife who also  recently immagrated from [place].   +tobaboo use > 50 yrs, at least 1ppd -alcohol -drugs  Risk Factors: Smoking Status: current (11/30/2009) Packs/Day: 1.0 (11/30/2009)  Past Medical History: Reviewed history from 05/21/2009 and no changes required. HTN, Ulcer,   Past Surgical History: Proctectomy and bladder repair surgery > 2 years ago.  Reconstructed at [facility] in 05/2009 Cataracts  Social History: Lives with son and 6 grand kids and his wife who also recently immagrated from [place].   +tobaboo use > 50 yrs, at least 1ppd -alcohol -drugs  Review of Systems       per hpi   Physical Exam  General:  Well-developed,well-nourished,in no acute distress; alert,appropriate and cooperative throughout examination. vitals reviewed.   Lungs:  Normal respiratory effort, chest expands symmetrically. Lungs are clear to auscultation, no crackles or wheezes. Heart:  Normal rate and regular rhythm. S1 and S2 normal without gallop, murmur, click, rub or other extra sounds. Abdomen:  +BS Abd soft No masses No hernia No guarding No rebound tenderness No suprapubic pain Bilateral lower quadrant pain to deep palpation Genitalia:  Testes bilaterally descended without nodularity, tenderness or masses. No scrotal masses or lesions. No penis lesions or urethral discharge. Msk:  no flank pain  no back pain  normal rom Inguinal Nodes:  No significant adenopathy   Impression & Recommendations:  Problem # 1:  ABDOMINAL PAIN (ICD-789.00) UA  result not ready yet.  This may be an atypical presentation of UTI.  Pt afebrile.  If UTI, will treat with Bactrim DS for 10 days.  Will await urine cx.  I am concerned about bilateral aspect of abd pain.  Pt has history of prostectomy for enlarged prostate.  He is followed by [facility].  I have looked in [place] and there is no recent CT abd/pelvis.  Will get CT abd/pelvis.  Will also check CBC and Cmet.  Pain is too low in abd for pancreatitis so will not get lipase.      CT abd/pelvis Wed 12/01/09 8AM Orders: Comp Met-[facility] ([id number]) CBC w/Diff-[facility] (59563) CT with Contrast (CT w/ contrast) [facility]- Est Level  3 (87564)  Complete Medication List: 1)  Hydrochlorothiazide 25 Mg Tabs (Hydrochlorothiazide) .... Take 1 tab daily 2)  Bactrim Ds 800-160 Mg Tabs (Sulfamethoxazole-trimethoprim) .Marland Kitchen.. 1 tab by mouth two times a day x 10 days  Other Orders: Urinalysis-[facility] (00000) Urine Culture-[facility] ([id number]) Prescriptions: BACTRIM DS 800-160 MG TABS (SULFAMETHOXAZOLE-TRIMETHOPRIM) 1 tab by mouth two times a day x 10 days  #20 x 0   Entered and Authorized by:   Angeline Slim MD   Signed by:   Angeline Slim MD on 11/30/2009   Method used:   Faxed to ...       [facility] Department (retail)       [address] [place], [place]  [address]       Ph: [phone]       Fax: [phone]   RxID:   [id number]    Laboratory Results   Urine Tests  Date/Time Received: November 30, 2009 2:22 PM  Date/Time Reported: November 30, 2009 2:49 PM   Routine Urinalysis   Color: yellow Appearance: slightly Cloudy Glucose: negative   (Normal Range: Negative) Bilirubin: negative   (Normal Range: Negative) Ketone: negative   (Normal Range: Negative) Spec. Gravity: 1.025   (Normal Range: 1.003-1.035) Blood: large   (Normal Range: Negative) pH: 5.0   (Normal Range: 5.0-8.0) Protein: 30   (Normal Range: Negative) Urobilinogen: 0.2   (Normal Range: 0-1) Nitrite: positive   (Normal Range: Negative) Leukocyte Esterace: small   (Normal Range: Negative)  Urine Microscopic WBC/HPF: TNTC with clumps RBC/HPF: TNTC Bacteria/HPF: 3+ with large clumps Epithelial/HPF: few    Comments: ...............test performed by......Marland KitchenBonnie A. [place], MLS (ASCP)cm

## 2010-06-18 ENCOUNTER — Encounter: Payer: Self-pay | Admitting: *Deleted

## 2010-08-22 LAB — POCT URINALYSIS DIP (DEVICE)
Glucose, UA: NEGATIVE mg/dL
Ketones, ur: NEGATIVE mg/dL

## 2011-07-18 ENCOUNTER — Encounter: Payer: Self-pay | Admitting: Family Medicine

## 2011-07-18 ENCOUNTER — Ambulatory Visit (INDEPENDENT_AMBULATORY_CARE_PROVIDER_SITE_OTHER): Payer: Self-pay | Admitting: Family Medicine

## 2011-07-18 VITALS — BP 188/96 | HR 81 | Temp 98.8°F | Wt 175.9 lb

## 2011-07-18 DIAGNOSIS — I10 Essential (primary) hypertension: Secondary | ICD-10-CM

## 2011-07-18 DIAGNOSIS — R300 Dysuria: Secondary | ICD-10-CM

## 2011-07-18 DIAGNOSIS — N390 Urinary tract infection, site not specified: Secondary | ICD-10-CM

## 2011-07-18 DIAGNOSIS — N400 Enlarged prostate without lower urinary tract symptoms: Secondary | ICD-10-CM

## 2011-07-18 LAB — COMPREHENSIVE METABOLIC PANEL
ALT: 9 U/L (ref 0–53)
Albumin: 4.2 g/dL (ref 3.5–5.2)
Alkaline Phosphatase: 71 U/L (ref 39–117)
BUN: 22 mg/dL (ref 6–23)
CO2: 26 mEq/L (ref 19–32)
Calcium: 9.5 mg/dL (ref 8.4–10.5)
Creat: 1.23 mg/dL (ref 0.50–1.35)
Glucose, Bld: 91 mg/dL (ref 70–99)
Potassium: 3.9 mEq/L (ref 3.5–5.3)
Sodium: 140 mEq/L (ref 135–145)
Total Bilirubin: 0.4 mg/dL (ref 0.3–1.2)
Total Protein: 7 g/dL (ref 6.0–8.3)

## 2011-07-18 LAB — POCT URINALYSIS DIPSTICK
Bilirubin, UA: NEGATIVE
Spec Grav, UA: 1.02

## 2011-07-18 LAB — POCT UA - MICROSCOPIC ONLY

## 2011-07-18 MED ORDER — CEPHALEXIN 500 MG PO CAPS: 500.0000 mg | ORAL_CAPSULE | Freq: Three times a day (TID) | ORAL | Status: AC

## 2011-07-18 MED ORDER — DOXAZOSIN MESYLATE 2 MG PO TABS: 2.0000 mg | ORAL_TABLET | Freq: Every day | ORAL | Status: DC

## 2011-07-18 MED ORDER — HYDROCHLOROTHIAZIDE 25 MG PO TABS: 25.0000 mg | ORAL_TABLET | Freq: Every day | ORAL | Status: DC

## 2011-07-18 NOTE — Assessment & Plan Note (Signed)
Significantly elevated again at this time. We will actually get patient back on his hydrochlorothiazide and recheck in 2 weeks. Patient had no red flags. Patient has not seen them for greater than a year or so we will get a complete metabolic panel as well.

## 2011-07-18 NOTE — Patient Instructions (Signed)
Is very good to see you both. I when she to come back and see me in 2 weeks to recheck her blood pressure. You do have a urinary tract infection and I'm giving you an antibiotic. Take one pill 2 times a day for the next 10 days.

## 2011-07-18 NOTE — Assessment & Plan Note (Signed)
Patient's UA is consistent for urinary tract infection with rods seen on microscopic exam. Will treat as Escherichia coli and will culture urine. Placed on Keflex 3 times a day for the next 10 days. Did not treat as a prostatitis with patient having a radical prostatectomy previously. Follow up in 2 weeks to make sure improved may do test of cure.

## 2011-07-18 NOTE — Progress Notes (Signed)
  Subjective:    Patient ID: Jack Thornton, male    DOB: [birth year], 78 y.o.   MRN: [id number]  HPI 78year-old male coming in with dysuria. Duration: 2 weeks Character of pain: Burning sensation with urination Hematuria: No Abdominal pain: Yes but very mildly no change in bowel habits. Fever: No Significant past medical history: Patient has had a radical prostatectomy with bladder reconstruction and is followed by [facility]. Since that time patient has also had urethral dilatation do to urethral strictures. Back pain: No Able to urinate: Yes but does seem to have some dribbling from time to time. Patient did recently come back from [place] had been there for the last 8 months Patient is not sexually active  Hypertension Blood pressure at home: Not checking also not taking his medicine Blood pressure today: 188/96 170 systolic on recheck Taking Meds: No Side effects: No ROS: Denies headache visual changes nausea, vomiting, chest pain or abdominal pain or shortness of breath.   Tobacco abuse-patient continues to smoke approximately 2-3 packs per day not wanting to quit was offered counseling patient declined. Review of Systems No fevers no chills no nausea no vomiting. No rash.    Objective:   Physical Exam Vitals reviewed General: No apparent distress Cardiovascular: Regular in rhythm no murmur Pulmonary: Clear to auscultation with some coarse breath sounds throughout no focal findings Abdomen: Nontender bowel sounds positive no masses or organomegaly GU: Testes descended bilaterally nontender no inguinal hernias noted prostate not palpable nonenlarged Urine dipstick shows positive for WBC's, positive for RBC's and positive for nitrates.  Micro exam: 30 WBC's per HPF, 5 RBC's per HPF and 3+ bacteria.     Assessment & Plan:

## 2011-07-19 ENCOUNTER — Encounter: Payer: Self-pay | Admitting: Family Medicine

## 2011-07-19 LAB — CBC WITH DIFFERENTIAL/PLATELET
Basophils Absolute: 0 10*3/uL (ref 0.0–0.1)
HCT: 46.4 % (ref 39.0–52.0)
Hemoglobin: 15.8 g/dL (ref 13.0–17.0)
Lymphocytes Relative: 50 % — ABNORMAL HIGH (ref 12–46)
MCV: 96.1 fL (ref 78.0–100.0)
Monocytes Relative: 10 % (ref 3–12)
Neutro Abs: 1.7 10*3/uL (ref 1.7–7.7)
Neutrophils Relative %: 36 % — ABNORMAL LOW (ref 43–77)
RDW: 13.3 % (ref 11.5–15.5)
WBC: 4.8 10*3/uL (ref 4.0–10.5)

## 2011-07-22 LAB — URINE CULTURE: Colony Count: >=100000

## 2011-07-24 ENCOUNTER — Telehealth: Payer: Self-pay | Admitting: Family Medicine

## 2011-07-24 MED ORDER — SULFAMETHOXAZOLE-TRIMETHOPRIM 800-160 MG PO TABS: 1.0000 | ORAL_TABLET | Freq: Two times a day (BID) | ORAL | Status: AC

## 2011-07-24 NOTE — Telephone Encounter (Signed)
Called told him that resistant to keflex, sent in bactrim will return in 2 weeks for recheck.

## 2011-08-04 ENCOUNTER — Encounter: Payer: Self-pay | Admitting: Family Medicine

## 2011-08-04 ENCOUNTER — Ambulatory Visit (INDEPENDENT_AMBULATORY_CARE_PROVIDER_SITE_OTHER): Payer: Self-pay | Admitting: Family Medicine

## 2011-08-04 VITALS — BP 155/68 | HR 73 | Temp 98.6°F | Wt 179.0 lb

## 2011-08-04 DIAGNOSIS — I10 Essential (primary) hypertension: Secondary | ICD-10-CM

## 2011-08-04 DIAGNOSIS — F17200 Nicotine dependence, unspecified, uncomplicated: Secondary | ICD-10-CM

## 2011-08-04 DIAGNOSIS — R300 Dysuria: Secondary | ICD-10-CM

## 2011-08-04 DIAGNOSIS — N390 Urinary tract infection, site not specified: Secondary | ICD-10-CM

## 2011-08-04 DIAGNOSIS — R319 Hematuria, unspecified: Secondary | ICD-10-CM | POA: Insufficient documentation

## 2011-08-04 LAB — POCT UA - MICROSCOPIC ONLY

## 2011-08-04 LAB — POCT URINALYSIS DIPSTICK
Ketones, UA: NEGATIVE
Leukocytes, UA: NEGATIVE
Nitrite, UA: NEGATIVE
Protein, UA: NEGATIVE
pH, UA: 7

## 2011-08-04 MED ORDER — HYDROCHLOROTHIAZIDE 25 MG PO TABS: 25.0000 mg | ORAL_TABLET | Freq: Every day | ORAL | Status: DC

## 2011-08-04 MED ORDER — DOXAZOSIN MESYLATE 2 MG PO TABS: 2.0000 mg | ORAL_TABLET | Freq: Every day | ORAL | Status: DC

## 2011-08-04 NOTE — Progress Notes (Signed)
  Subjective:    Patient ID: Jack Thornton, male    DOB: [birth year], 78 y.o.   MRN: [id number]  HPI 1. Hypertension Blood pressure at home: Not Blood pressure today: 155/68 significant improvement from 180s Taking Meds: Yes Side effects: Patient does state he urinates a lot more now. ROS: Denies headache visual changes nausea, vomiting, chest pain or abdominal pain or shortness of breath.  Patient is also here for followup of urinary tract infection. Patient last time did have a culture done that showed greater than 100,000 Escherichia coli that was sensitive to Bactrim. This medication was called in for patient's who has been taking it. Patient is asymptomatic at this time. Denies any type of dysuria hematuria or back pain or fever.   Review of Systems Denies fever, chills, nausea vomiting abdominal pain, dysuria, chest pain, shortness of breath dyspnea on exertion or numbness in extremities    Objective:   Physical Exam Vitals reviewed General: No apparent distress Cardiovascular: Regular in rhythm no murmur Pulmonary: Clear to auscultation with some coarse breath sounds throughout no focal findings Abdomen: Nontender bowel sounds positive no masses or organomegaly GU: Testes descended bilaterally nontender no inguinal hernias noted prostate not palpable nonenlarged Urine dipstick shows negative for WBC's, positive for RBC's and negative for nitrates.  Micro exam:  5 RBC's per HPF.    Assessment & Plan:

## 2011-08-04 NOTE — Assessment & Plan Note (Addendum)
Improved at this time, we'll not make any other significant changes such as adding another medication to try to get his systolic to 120. Patient has improved is 78 years old do feel that our goal is to have them with a systolic blood pressures less than 160. Discussed with patient that we could Potentially changing to calcium channel blocker at this time we'll continue the same medication.

## 2011-08-04 NOTE — Assessment & Plan Note (Signed)
Still smoking

## 2011-08-04 NOTE — Patient Instructions (Signed)
Your blood pressure is much better. I want you to continue on the hydrochlorothiazide, if your urinate too much or having trouble please call me and I can make a change of medications. [facility]. Please come back in 2-3 months for another blood pressure check.

## 2011-08-04 NOTE — Assessment & Plan Note (Signed)
Patient has had hematuria on UA exam even after treatment for urinary tract infection. Does have history of instrumentation done at [facility] for hyperplastic prostate. If continues would consider sending to urology for further workup of the cystoscopy and CT ureterogram.Patient has history of tobacco abuse of 3 packs per day which is of concern for cancer but no weight loss no fevers no gross hematuria.  Have discussed with patient and wanted to hold off on August 04, 2011 with further evaluation.

## 2011-08-04 NOTE — Assessment & Plan Note (Signed)
Testicular is negative except still having recurrent hematuria we'll continue to monitor. Discussed at length with patient's son as well as patient about the potential of going to urology at this point they would like to hold off no red flags and when to seek medical attention. We'll continue to monitor.

## 2011-10-04 ENCOUNTER — Encounter: Payer: Self-pay | Admitting: Family Medicine

## 2011-10-04 ENCOUNTER — Ambulatory Visit (INDEPENDENT_AMBULATORY_CARE_PROVIDER_SITE_OTHER): Payer: Self-pay | Admitting: Family Medicine

## 2011-10-04 VITALS — BP 135/75 | HR 77 | Temp 98.3°F | Wt 183.0 lb

## 2011-10-04 DIAGNOSIS — R300 Dysuria: Secondary | ICD-10-CM

## 2011-10-04 DIAGNOSIS — F17200 Nicotine dependence, unspecified, uncomplicated: Secondary | ICD-10-CM

## 2011-10-04 DIAGNOSIS — I10 Essential (primary) hypertension: Secondary | ICD-10-CM

## 2011-10-04 DIAGNOSIS — N390 Urinary tract infection, site not specified: Secondary | ICD-10-CM

## 2011-10-04 DIAGNOSIS — N400 Enlarged prostate without lower urinary tract symptoms: Secondary | ICD-10-CM

## 2011-10-04 DIAGNOSIS — A048 Other specified bacterial intestinal infections: Secondary | ICD-10-CM

## 2011-10-04 LAB — POCT URINALYSIS DIPSTICK
Glucose, UA: NEGATIVE
Ketones, UA: NEGATIVE
Nitrite, UA: POSITIVE
Urobilinogen, UA: 0.2
pH, UA: 5.5

## 2011-10-04 LAB — POCT UA - MICROSCOPIC ONLY

## 2011-10-04 MED ORDER — CIPROFLOXACIN HCL 500 MG PO TABS: 500.0000 mg | ORAL_TABLET | Freq: Two times a day (BID) | ORAL | Status: AC

## 2011-10-04 MED ORDER — LISINOPRIL 20 MG PO TABS: 20.0000 mg | ORAL_TABLET | Freq: Every day | ORAL | Status: DC

## 2011-10-04 NOTE — Patient Instructions (Signed)
Try this antibiotic. Take one pill twice a day for 3 weeks. I am also changing your blood pressure medicine to hopefully help with your urination. The new medication is called lisinopril take one pill daily. I want you to stop her hydrochlorothiazide. Please come back at the scheduled appointment above. We will get some lab work when I see you again.

## 2011-10-04 NOTE — Progress Notes (Signed)
  Subjective:    Patient ID: Jack Thornton, male    DOB: 04/28/1930, 78 y.o.   MRN: [id number]  HPI  1. Hypertension Blood pressure at home: Not Blood pressure today: 155/68 significant improvement from 180s Taking Meds: Yes Side effects: Patient does state he urinates a lot more now. ROS: Denies headache visual changes nausea, vomiting, chest pain or abdominal pain or shortness of breath.  GERD- no pain at this time well controlled on no medications.  Dysuria-patient has had a long-standing history of this dysuria. Patient had previously seen a urologist at [facility] but is unable to afford any more visits. Patient did have a prostate surgery to help which it did originally. Patient did not have any history of cancer. Patient still complains of dysuria and some mild hematuria. Patient is also complaining that he potentially is having some incontinence from time to time. Patient states that this did start when he was put on the blood pressure medicine hydrochlorothiazide.   Review of Systems  Denies fever, chills, nausea vomiting abdominal pain, dysuria, chest pain, shortness of breath dyspnea on exertion or numbness in extremities    Objective:   Physical Exam  Vitals reviewed General: No apparent distress Cardiovascular: Regular in rhythm no murmur Pulmonary: Clear to auscultation with some coarse breath sounds throughout no focal findings Abdomen: Nontender bowel sounds positive no masses or organomegaly GU: Testes descended bilaterally nontender no inguinal hernias noted prostate not palpable nonenlarged does not appear tender. Urine dipstick shows negative for WBC's, positive for RBC's and negative for nitrates.  Micro exam:  5 RBC's per HPF.    Assessment & Plan:

## 2011-10-04 NOTE — Assessment & Plan Note (Signed)
Patient is continuing to have some trouble likely still has an infection potentially a prostatitis. At this time will treat for 3 weeks to see if it improves. If he continued to have problems we will send to urology.

## 2011-10-04 NOTE — Assessment & Plan Note (Addendum)
Patient continues to have the trouble is a boggy prostate exam. At this time we'll do a long treatment of ciprofloxacin 3 weeks have patient come back at that time to make sure he is improving. Patient was confused on his cardura, told him to stop taking it at this time and monitor.  If symptoms get worse to start medication again. Changed the HCTZ to lisinopril as well.

## 2011-10-04 NOTE — Progress Notes (Signed)
Addended by: [place], Dearius Hoffmann on: 10/04/2011 03:29 PM   Modules accepted: Orders

## 2011-10-04 NOTE — Progress Notes (Signed)
Addended by: [place], Lenus Trauger on: 10/04/2011 04:08 PM   Modules accepted: Orders

## 2011-10-04 NOTE — Assessment & Plan Note (Signed)
Change to lisinopril today secondary to polyuria. Continue to monitor her at followup patient will need to bmet. Followup in 2 weeks

## 2011-10-07 LAB — URINE CULTURE: Colony Count: >=100000

## 2011-10-25 ENCOUNTER — Ambulatory Visit (INDEPENDENT_AMBULATORY_CARE_PROVIDER_SITE_OTHER): Payer: Self-pay | Admitting: Family Medicine

## 2011-10-25 ENCOUNTER — Encounter: Payer: Self-pay | Admitting: Family Medicine

## 2011-10-25 VITALS — BP 107/66 | HR 81 | Temp 98.4°F | Wt 184.2 lb

## 2011-10-25 DIAGNOSIS — F17200 Nicotine dependence, unspecified, uncomplicated: Secondary | ICD-10-CM

## 2011-10-25 DIAGNOSIS — I10 Essential (primary) hypertension: Secondary | ICD-10-CM

## 2011-10-25 DIAGNOSIS — N400 Enlarged prostate without lower urinary tract symptoms: Secondary | ICD-10-CM

## 2011-10-25 NOTE — Assessment & Plan Note (Signed)
Patient has been treated for potential prostatitis without any improvement. Patient is still seems to be having some problem. Patient declined another round of antibiotics. I am more concerned with a potential urethral stricture that might be giving him some of his problem. Patient does have a long-standing history of smoking and is at increased risk of bladder cancer. Will send patient back to [facility] medicine in the urology department for further evaluation in potential cystoscopy.

## 2011-10-25 NOTE — Assessment & Plan Note (Signed)
Declined counseling.

## 2011-10-25 NOTE — Progress Notes (Signed)
Patient ID: Jack Thornton, male   DOB: Aug 13, 1929, 77 y.o.   MRN: [id number]  Subjective:    Patient ID: Jack Thornton, male    DOB: October 17, 1929, 78 y.o.   MRN: [id number]  HPI 1. Hypertension Blood pressure at home: Not Blood pressure today: 107/66 significant improvement  Taking Meds: Yes change from hydrochlorothiazide to lisinopril. Side effects: Patient though states that this did not help his urine and has felt a little more tired than usual. Patient also states that he was also taking his hydrochlorothiazide still. ROS: Denies headache visual changes nausea, vomiting, chest pain or abdominal pain or shortness of breath.  GERD- no pain at this time well controlled on no medications.  Dysuria-patient has had a long-standing history of this dysuria. Patient had previously seen a urologist at [facility] but is unable to afford any more visits. Patient did have a prostate surgery to help which it did originally. Patient did not have any history of cancer. Patient still complains of dysuria and some mild hematuria. Patient was treated with 3 weeks of ciprofloxacin and states that it did not improve. Patient states it is hard for him to keep a single stream actually and states it's more in multiple streams and goes all over. Patient is also complaining that he potentially is having some incontinence from time to time.   Review of Systems Denies fever, chills, nausea vomiting abdominal pain, dysuria, chest pain, shortness of breath dyspnea on exertion or numbness in extremities    Objective:   Physical Exam Vitals reviewed General: No apparent distress Cardiovascular: Regular in rhythm no murmur Pulmonary: Clear to auscultation with some coarse breath sounds throughout no focal findings Abdomen: Nontender bowel sounds positive no masses or organomegaly GU: Testes descended bilaterally nontender no inguinal hernias noted prostate not palpable nonenlarged does not appear tender.     Assessment & Plan:

## 2011-10-25 NOTE — Patient Instructions (Addendum)
It is good to see you both. I want you to call [facility] urology. Please make an appointment. New number is [phone]. If they say they need a referral please call me back and I do not mind putting that information in. Please stop the Flomax. If he has trouble starting urination or has trouble keeping a stream please call me and restart his Flomax. I want you to use Colace and MiraLAX twice daily. Patient help with the constipation. It has been a pleasure getting to know you over these years.

## 2011-10-25 NOTE — Assessment & Plan Note (Signed)
At goal no changes. 

## 2011-12-13 ENCOUNTER — Emergency Department (HOSPITAL_COMMUNITY)
Admission: EM | Admit: 2011-12-13 | Discharge: 2011-12-13 | Disposition: A | Discharge status: Other Acute Inpatient Hospital | Payer: Self-pay | Attending: Emergency Medicine | Admitting: Emergency Medicine

## 2011-12-13 ENCOUNTER — Other Ambulatory Visit: Payer: Self-pay

## 2011-12-13 ENCOUNTER — Inpatient Hospital Stay (HOSPITAL_COMMUNITY)
Admission: EM | Admit: 2011-12-13 | Discharge: 2011-12-15 | DRG: 194 | Disposition: A | Discharge status: Home / Self Care | Payer: Self-pay | Attending: Family Medicine | Admitting: Family Medicine

## 2011-12-13 DIAGNOSIS — Z8546 Personal history of malignant neoplasm of prostate: Secondary | ICD-10-CM

## 2011-12-13 DIAGNOSIS — Z79899 Other long term (current) drug therapy: Secondary | ICD-10-CM

## 2011-12-13 DIAGNOSIS — N400 Enlarged prostate without lower urinary tract symptoms: Secondary | ICD-10-CM | POA: Diagnosis present

## 2011-12-13 DIAGNOSIS — R509 Fever, unspecified: Secondary | ICD-10-CM

## 2011-12-13 DIAGNOSIS — N390 Urinary tract infection, site not specified: Secondary | ICD-10-CM | POA: Diagnosis present

## 2011-12-13 DIAGNOSIS — Z720 Tobacco use: Secondary | ICD-10-CM | POA: Diagnosis present

## 2011-12-13 DIAGNOSIS — F17200 Nicotine dependence, unspecified, uncomplicated: Secondary | ICD-10-CM | POA: Diagnosis present

## 2011-12-13 DIAGNOSIS — J159 Unspecified bacterial pneumonia: Principal | ICD-10-CM | POA: Diagnosis present

## 2011-12-13 DIAGNOSIS — N138 Other obstructive and reflux uropathy: Secondary | ICD-10-CM | POA: Diagnosis present

## 2011-12-13 DIAGNOSIS — I10 Essential (primary) hypertension: Secondary | ICD-10-CM | POA: Diagnosis present

## 2011-12-13 DIAGNOSIS — J189 Pneumonia, unspecified organism: Secondary | ICD-10-CM

## 2011-12-13 DIAGNOSIS — N320 Bladder-neck obstruction: Secondary | ICD-10-CM | POA: Diagnosis present

## 2011-12-13 DIAGNOSIS — N401 Enlarged prostate with lower urinary tract symptoms: Secondary | ICD-10-CM | POA: Diagnosis present

## 2011-12-13 HISTORY — DX: Malignant neoplasm of prostate: C61

## 2011-12-13 HISTORY — DX: Essential (primary) hypertension: I10

## 2011-12-13 NOTE — ED Notes (Signed)
Family called out for hypertension but upon arrival pt BP was in 150s. Fever was 103. L wrist 18g IV. 500cc saline. 4mg  Zofran.

## 2011-12-13 NOTE — ED Notes (Signed)
JYN:[id number]<AO> Expected date:<BR> Expected time:<BR> Means of arrival:<BR> Comments:<BR> EMS/fever

## 2011-12-14 ENCOUNTER — Encounter (HOSPITAL_COMMUNITY): Payer: Self-pay | Admitting: Family Medicine

## 2011-12-14 ENCOUNTER — Emergency Department (HOSPITAL_COMMUNITY): Payer: Self-pay

## 2011-12-14 ENCOUNTER — Other Ambulatory Visit (HOSPITAL_COMMUNITY): Payer: Self-pay

## 2011-12-14 ENCOUNTER — Observation Stay (HOSPITAL_COMMUNITY): Payer: Self-pay

## 2011-12-14 DIAGNOSIS — R509 Fever, unspecified: Secondary | ICD-10-CM | POA: Insufficient documentation

## 2011-12-14 DIAGNOSIS — J189 Pneumonia, unspecified organism: Secondary | ICD-10-CM | POA: Insufficient documentation

## 2011-12-14 DIAGNOSIS — J159 Unspecified bacterial pneumonia: Secondary | ICD-10-CM | POA: Diagnosis present

## 2011-12-14 DIAGNOSIS — I10 Essential (primary) hypertension: Secondary | ICD-10-CM

## 2011-12-14 DIAGNOSIS — Z720 Tobacco use: Secondary | ICD-10-CM | POA: Diagnosis present

## 2011-12-14 LAB — COMPREHENSIVE METABOLIC PANEL
Alkaline Phosphatase: 49 U/L (ref 39–117)
BUN: 21 mg/dL (ref 6–23)
CO2: 23 mEq/L (ref 19–32)
Calcium: 8.4 mg/dL (ref 8.4–10.5)
GFR calc Af Amer: 53 mL/min — ABNORMAL LOW (ref >90)
GFR calc non Af Amer: 46 mL/min — ABNORMAL LOW (ref >90)
Glucose, Bld: 120 mg/dL — ABNORMAL HIGH (ref 70–99)
Total Protein: 6.5 g/dL (ref 6.0–8.3)

## 2011-12-14 LAB — CBC WITH DIFFERENTIAL/PLATELET
Basophils Absolute: 0 10*3/uL (ref 0.0–0.1)
Basophils Relative: 0 % (ref 0–1)
Eosinophils Absolute: 0 10*3/uL (ref 0.0–0.7)
Eosinophils Absolute: 0 10*3/uL (ref 0.0–0.7)
Eosinophils Relative: 0 % (ref 0–5)
Eosinophils Relative: 0 % (ref 0–5)
HCT: 39 % (ref 39.0–52.0)
Hemoglobin: 13.7 g/dL (ref 13.0–17.0)
Lymphs Abs: 0.4 10*3/uL — ABNORMAL LOW (ref 0.7–4.0)
MCH: 32.8 pg (ref 26.0–34.0)
MCH: 33.3 pg (ref 26.0–34.0)
MCV: 93.7 fL (ref 78.0–100.0)
MCV: 94.7 fL (ref 78.0–100.0)
Monocytes Relative: 2 % — ABNORMAL LOW (ref 3–12)
RBC: 4.12 MIL/uL — ABNORMAL LOW (ref 4.22–5.81)
RDW: 13.1 % (ref 11.5–15.5)
WBC: 7.2 10*3/uL (ref 4.0–10.5)

## 2011-12-14 LAB — CBC
Hemoglobin: 11.8 g/dL — ABNORMAL LOW (ref 13.0–17.0)
MCH: 33.1 pg (ref 26.0–34.0)
MCV: 92.7 fL (ref 78.0–100.0)
RBC: 3.57 MIL/uL — ABNORMAL LOW (ref 4.22–5.81)

## 2011-12-14 LAB — URINALYSIS, ROUTINE W REFLEX MICROSCOPIC
Glucose, UA: NEGATIVE mg/dL
Protein, ur: NEGATIVE mg/dL

## 2011-12-14 LAB — BASIC METABOLIC PANEL
CO2: 21 mEq/L (ref 19–32)
GFR calc non Af Amer: 47 mL/min — ABNORMAL LOW (ref >90)
Glucose, Bld: 109 mg/dL — ABNORMAL HIGH (ref 70–99)
Potassium: 3.5 mEq/L (ref 3.5–5.1)
Sodium: 135 mEq/L (ref 135–145)

## 2011-12-14 LAB — PROCALCITONIN: Procalcitonin: 0.61 ng/mL

## 2011-12-14 LAB — URINE MICROSCOPIC-ADD ON

## 2011-12-14 MED ORDER — DEXTROSE 5 % IV SOLN: 1.0000 g | INTRAVENOUS | Status: DC

## 2011-12-14 MED ORDER — DEXTROSE 5 % IV SOLN
1.0000 g | Freq: Once | INTRAVENOUS | Status: AC
  Administered 2011-12-14: 1 g via INTRAVENOUS
  Filled 2011-12-14: qty 10

## 2011-12-14 MED ORDER — ONDANSETRON HCL 4 MG/2ML IJ SOLN
INTRAMUSCULAR | Status: AC
  Filled 2011-12-14: qty 2

## 2011-12-14 MED ORDER — SODIUM CHLORIDE 0.9 % IV BOLUS (SEPSIS)
2000.0000 mL | Freq: Once | INTRAVENOUS | Status: AC
  Administered 2011-12-14: 2000 mL via INTRAVENOUS

## 2011-12-14 MED ORDER — SODIUM CHLORIDE 0.9 % IV SOLN
INTRAVENOUS | Status: DC
  Administered 2011-12-14 – 2011-12-15 (×2): via INTRAVENOUS

## 2011-12-14 MED ORDER — LISINOPRIL 20 MG PO TABS
20.0000 mg | ORAL_TABLET | Freq: Every day | ORAL | Status: DC
Start: 2011-12-14 — End: 2011-12-15
  Administered 2011-12-15: 20 mg via ORAL
  Filled 2011-12-14 (×2): qty 1

## 2011-12-14 MED ORDER — AZITHROMYCIN 500 MG PO TABS
500.0000 mg | ORAL_TABLET | ORAL | Status: DC
  Filled 2011-12-14: qty 1

## 2011-12-14 MED ORDER — HEPARIN SODIUM (PORCINE) 5000 UNIT/ML IJ SOLN
5000.0000 [IU] | Freq: Three times a day (TID) | INTRAMUSCULAR | Status: DC
  Administered 2011-12-14 – 2011-12-15 (×4): 5000 [IU] via SUBCUTANEOUS
  Filled 2011-12-14 (×7): qty 1

## 2011-12-14 MED ORDER — NICOTINE 14 MG/24HR TD PT24
14.0000 mg | MEDICATED_PATCH | Freq: Every day | TRANSDERMAL | Status: DC
  Administered 2011-12-14 – 2011-12-15 (×2): 14 mg via TRANSDERMAL
  Filled 2011-12-14 (×3): qty 1

## 2011-12-14 MED ORDER — SODIUM CHLORIDE 0.9 % IV BOLUS (SEPSIS): 1000.0000 mL | Freq: Once | INTRAVENOUS | Status: DC

## 2011-12-14 MED ORDER — DOXAZOSIN MESYLATE 2 MG PO TABS
2.0000 mg | ORAL_TABLET | Freq: Every day | ORAL | Status: DC
  Administered 2011-12-14 – 2011-12-15 (×2): 2 mg via ORAL
  Filled 2011-12-14 (×2): qty 1

## 2011-12-14 MED ORDER — ONDANSETRON HCL 4 MG/2ML IJ SOLN
INTRAMUSCULAR | Status: AC
  Administered 2011-12-13
  Filled 2011-12-14: qty 2

## 2011-12-14 MED ORDER — ACETAMINOPHEN 325 MG PO TABS
650.0000 mg | ORAL_TABLET | ORAL | Status: DC | PRN
  Administered 2011-12-14: 650 mg via ORAL
  Filled 2011-12-14 (×2): qty 2

## 2011-12-14 MED ORDER — PIPERACILLIN-TAZOBACTAM 3.375 G IVPB
3.3750 g | Freq: Three times a day (TID) | INTRAVENOUS | Status: DC
  Administered 2011-12-14 – 2011-12-15 (×3): 3.375 g via INTRAVENOUS
  Filled 2011-12-14 (×5): qty 50

## 2011-12-14 MED ORDER — ACETAMINOPHEN 325 MG PO TABS
650.0000 mg | ORAL_TABLET | Freq: Four times a day (QID) | ORAL | Status: DC | PRN
  Administered 2011-12-14: 650 mg via ORAL
  Filled 2011-12-14: qty 2

## 2011-12-14 MED ORDER — DEXTROSE 5 % IV SOLN
500.0000 mg | Freq: Once | INTRAVENOUS | Status: AC
  Administered 2011-12-14: 500 mg via INTRAVENOUS
  Filled 2011-12-14: qty 500

## 2011-12-14 NOTE — Progress Notes (Signed)
FMTS Attending Daily Note:  Renold Don MD  [phone] pager  Family Practice pager:  [phone] I reviewed this patient and have reviewed their chart. I have discussed this patient with the resident and admitting attending Dr. Leveda Anna. I agree with the resident's findings, assessment and care plan.

## 2011-12-14 NOTE — H&P (Signed)
Family Medicine Teaching [facility] Admission History and Physical Service Pager: [phone]  Patient name: Jack Thornton Medical record number: [id number] Date of birth: 04-Apr-1930 Age: 78 y.o. Gender: male  Primary Care Provider: No primary provider on file.  Chief Complaint: Fever  Assessment and Plan: Jack Thornton is a 78 y.o. year old male presenting with fevers, chills, one episode of emesis, and a subtle left lower lobe infiltrate on chest x-ray. 1. Community acquired pneumonia, likely bacterial: Due to the patient's age and smoking status, I feel that it is reasonable to treat for a community-acquired pneumonia based upon the patient's history and chest x-ray findings despite an absence of a white count. Due to his history of recurrent urinary tract infections I would like to obtain a urinalysis and culture in addition to the blood cultures already ordered by the emergency room. Assuming this does not show evidence of infection, I feel it is reasonable to say that his fevers are being caused by a pulmonary source. This is compatible with his normal lactic acid and slightly elevated procalcitonin. I will continue treatment with Rocephin and azithromycin and monitor him. Depending on his clinical course he may be ready for discharge as early as late this afternoon versus tomorrow morning. 2. Hypertension: We'll continue the patient's home lisinopril 3. Tobacco abuse: Smoking cessation counseling although the patient has been very resistant to this in the past 4. Prostate hypertrophy: Continue patient's home medications 5. FEN/GI: As the patient is currently fasting for religious reasons I will give him a regular diet that is available after 8:30 PM (this is when he is allowed to eat) I will also run normal saline at 75 per hour to avoid any dehydration. 6. Prophylaxis: Subcutaneous heparin 7. Disposition: Floor bed, observation status 8. Code Status: Full code  History of Present Illness:  Jack Thornton is a 78 y.o. year old male presenting with fevers, chills and a single episode of emesis.  The patient was in his normal state of health up until around 8 PM yesterday evening. At that time he had the onset of fevers and chills. He had one episode of nonbloody nonbilious emesis. After this episode his either and chills persisted and he was brought to the emergency department where he was found to have a subtle infiltrate on chest x-ray. Due to this finding, his age, and smoking status, admission was requested for community-acquired pneumonia.  On interview the patient denies fevers or chills prior to 8 PM yesterday. He has had a cough on and off for the last several days. He continues to smoke. He has not had prior episodes of emesis. He denies current nausea. No blood in stool, no dysuria, no abdominal pain. No visual changes, no headaches.  Review of patient's history reveals that he has had recurrent urinary tract infections.  Patient Active Problem List  Diagnosis  . Hypertension  . Community acquired bacterial pneumonia  . Tobacco abuse   Past Medical History: Past Medical History  Diagnosis Date  . Prostate cancer    Past Surgical History: Past Surgical History  Procedure Date  . Prostate surgery   . Urethral dilation    Social History: History  Substance Use Topics  . Smoking status: Current Everyday Smoker -- 1.0 packs/day  . Smokeless tobacco: Never Used  . Alcohol Use: Not on file   For any additional social history documentation, please refer to relevant sections of EMR.  Family History: Family History  Problem Relation Age of Onset  .  Hypertension      mother and father  . Coronary artery disease      father   Allergies: No Known Allergies No current facility-administered medications on file prior to encounter.   Current Outpatient Prescriptions on File Prior to Encounter  Medication Sig Dispense Refill  . doxazosin (CARDURA) 2 MG tablet Take 2  mg by mouth daily.      Marland Kitchen lisinopril (PRINIVIL,ZESTRIL) 20 MG tablet Take 20 mg by mouth daily.       Review Of Systems: Per HPI. Otherwise 12 point review of systems was performed and was unremarkable.  Physical Exam: BP 110/64  Pulse 87  Temp 99.3 F (37.4 C) (Oral)  Resp 19  Ht 5\' 2"  (1.575 m)  Wt 178 lb 12.7 oz (81.1 kg)  BMI 32.70 kg/m2  SpO2 99% Exam: General: No acute distress, resting comfortably in bed HEENT: Mucous membranes moist, extraocular movements intact. Left pupil is round reactive. Right eye has a white film over it. This is reported to be many years old secondary to a surgery prior to coming to the [place]. Cardiovascular: Regular rate and rhythm without murmurs Respiratory: Bibasilar crackles with prolonged inspiration. There is note of any. There are no other focal findings. Abdomen: Soft, nontender, nondistended Extremities: 2+ pulses, no edema Skin: No rashes or bruising Neuro: Cranial nerves II through XII intact. Patient is alert and oriented x3. Patient is appropriately sleepy.  Labs and Imaging: CBC BMET   Lab 12/14/11 0040  WBC 7.2  HGB 13.5  HCT 38.6*  PLT CANCELLED BY LAB   No results found for this basename: NA,K,CL,CO2,BUN,CREATININE,GLUCOSE,CALCIUM in the last 168 hours  Results for QUADRE, BRISTOL (MRN [id number]) as of 12/14/2011 04:31  12/14/2011 00:40  Sodium 135  Potassium 3.5  Chloride 104  CO2 21  BUN 23  Creat 1.38 (H)  Calcium 8.2 (L)  GFR calc non Af Amer 47 (L)  GFR calc Af Amer 54 (L)  Glucose 109 (H)  Troponin I <0.30  Lactic Acid, Venous 1.3  Procalcitonin 0.61  WBC 8.2  RBC 3.57 (L)  Hemoglobin 11.8 (L)  HCT 33.1 (L)  MCV 92.7  MCH 33.1  MCHC 35.6  RDW 13.0  Platelets 129 (L)   Chest X-Ray: Question of early left lower lobe infiltrate.  Shanikia Kernodle, MD 12/14/2011, 4:41 AM

## 2011-12-14 NOTE — ED Provider Notes (Signed)
Merged medical records. Please see my documentation on MRN [id number]    Lyanne Co, MD 12/14/11 [phone]

## 2011-12-14 NOTE — ED Provider Notes (Signed)
History     CSN: [id number]  Arrival date & time 12/13/11  2351   First MD Initiated Contact with Patient 12/14/11 0020      Chief Complaint  Patient presents with  . Fever    HPI The patient's had productive cough for several days and today developed riders chills and shortness of breath.  He was found by EMS to have a fever of 103 his heart rate was in the 150s.  The patient denies chest pain.  He does report mild shortness of breath and exertional shortness of breath.  He denies abdominal pain.  His been nauseated but has had no vomiting or diarrhea.  Denies rash.  He has no abdominal pain.  Denies dysuria and urinary frequency.  He has no lightheadedness.  Nothing worsens or improves his symptoms.  His symptoms are mild to moderate in severity    Past Medical History  Diagnosis Date  . ACUT GASTR ULCER W/O MENTION HEMORR PERF/OBST 05/21/2009  . Essential hypertension, benign 05/21/2009  . HELICOBACTER PYLORI INFECTION 06/14/2009  . TOBACCO ABUSE 05/21/2009  . 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 Hematuria 08/04/2011    Patient has had hematuria on UA exam even after treatment for urinary tract infection. Does have history of instrumentation done at [facility] for hyperplastic prostate. If continues would consider sending to urology for further workup of the cystoscopy and    Past Surgical History  Procedure Date  . Prostate surgery   . Bladder neck reconstruction   . Urethral dilation    Family History  Problem Relation Age of Onset  . Hypertension Mother   . Hypertension Father   . Heart disease Father    History  Substance Use Topics  . Smoking status: Current Everyday Smoker -- 1.0 packs/day    Types: Cigarettes  . Smokeless tobacco: Never Used  . Alcohol Use: No       Review of Systems  All other systems reviewed and are negative.    Allergies  Review of patient's allergies indicates not on file.  Home Medications   Current Outpatient Rx    Name Route Sig Dispense Refill  . DOCUSATE SODIUM 100 MG PO CAPS Oral Take 100 mg by mouth 2 (two) times daily.      Marland Kitchen LISINOPRIL 20 MG PO TABS Oral Take 1 tablet (20 mg total) by mouth daily. 90 tablet 3  . POLYETHYLENE GLYCOL 3350 PO POWD Oral Take 17 g by mouth 2 (two) times daily.        BP 144/61  Pulse 118  Temp 103 F (39.4 C) (Rectal)  Resp 20  SpO2 96%  Physical Exam  Nursing note and vitals reviewed. Constitutional: He is oriented to person, place, and time. He appears well-developed and well-nourished.  HENT:  Head: Normocephalic and atraumatic.  Eyes: EOM are normal.  Neck: Normal range of motion.  Cardiovascular: Normal rate, regular rhythm, normal heart sounds and intact distal pulses.   Pulmonary/Chest: Effort normal and breath sounds normal. No respiratory distress.  Abdominal: Soft. He exhibits no distension. There is no tenderness.  Musculoskeletal: Normal range of motion.  Neurological: He is alert and oriented to person, place, and time.  Skin: Skin is warm and dry.  Psychiatric: He has a normal mood and affect. Judgment normal.    ED Course  Procedures (including critical care time)   Date: 12/14/2011  Rate: 117  Rhythm: Sinus tachycardia  QRS Axis: normal  Intervals:  normal  ST/T Wave abnormalities: normal  Conduction Disutrbances: none  Narrative Interpretation:   Old EKG Reviewed: No significant changes noted     Labs Reviewed  CBC WITH DIFFERENTIAL - Abnormal; Notable for the following:    RBC 4.12 (*)     HCT 38.6 (*)     Neutrophils Relative 94 (*)     Lymphocytes Relative 5 (*)     Lymphs Abs 0.3 (*)     Monocytes Relative 2 (*)     All other components within normal limits  CULTURE, BLOOD (ROUTINE X 2)  CULTURE, BLOOD (ROUTINE X 2)   Dg Chest Portable 1 View  12/14/2011  *RADIOLOGY REPORT*  Clinical Data: Fever.  PORTABLE CHEST - 1 VIEW  Comparison: None.  Findings: Cardiomediastinal silhouette is within normal limits.  No  pleural effusions.  There is minimal patchy density at the lung bases, left greater than right.  Although the findings may be chronic, early infiltrate is not entirely excluded given the lack of prior films for comparison.  IMPRESSION: Question of early left lower lobe infiltrate.  Original Report Authenticated By: Patterson Hammersmith, M.D.    I personally reviewed the imaging tests through PACS system  I reviewed available ER/hospitalization records thought the EMR   1. CAP (community acquired pneumonia)   2. Fever       MDM  The patient has evidence of community acquired pneumonia and fever.  He will be admitted to the family practice service at [facility]. Stable for transfer        Lyanne Co, MD 12/14/11 [phone]

## 2011-12-14 NOTE — Progress Notes (Signed)
PGY-1 Daily Progress Note Family Medicine Teaching Service [place], [place] Service Pager: [phone]   Subjective:  78 year old male with a PMH of HTN, tobacco abuse, and prostate cancer admitted with CAP and possible UTI.  Patient had fever and rigors last night (8/1) and was given Tylenol.  This a.m. [name] states that he is feeling much better following tylenol.  Denies fever, chills, SOB, chest pain.    Of note, patient was seen by [facility] urology yesterday and they attempted to pass a urinary catheter and failed.  Objective:  VITALS Temp:  [99 F (37.2 C)-103 F (39.4 C)] 99 F (37.2 C) (08/01 1015) Pulse Rate:  [74-118] 74  (08/01 1015) Resp:  [16-21] 18  (08/01 1015) BP: (110-170)/(50-143) 117/65 mmHg (08/01 1015) SpO2:  [96 %-100 %] 97 % (08/01 1015) FiO2 (%):  [28 %] 28 % (08/01 0527) Weight:  [178 lb 12.7 oz (81.1 kg)] 178 lb 12.7 oz (81.1 kg) (08/01 0439)  In/Out  Intake/Output Summary (Last 24 hours) at 12/14/11 1249 Last data filed at 12/14/11 0700  Gross per 24 hour  Intake    395 ml  Output    400 ml  Net     -5 ml    Physical Exam: Gen:  Alert and awake. NAD. HEENT: Moist mucous membranes.  CV: Regular rate and rhythm, no murmurs rubs or gallops. PULM: clear to auscultation bilaterally. No rales, rhonchi, or wheeze. ABD: soft, nontender, nondistended.  +BS. EXT: No edema noted.   MEDS Scheduled Meds:   . azithromycin (ZITHROMAX) 500 MG IVPB  500 mg Intravenous Once  . cefTRIAXone (ROCEPHIN)  IV  1 g Intravenous Once  . doxazosin  2 mg Oral Daily  . heparin  5,000 Units Subcutaneous Q8H  . lisinopril  20 mg Oral Daily  . nicotine  14 mg Transdermal Q breakfast  . ondansetron      . ondansetron      . sodium chloride  2,000 mL Intravenous Once  . DISCONTD: azithromycin  500 mg Oral Q24H  . DISCONTD: cefTRIAXone (ROCEPHIN)  IV  1 g Intravenous Q24H   Continuous Infusions:   . sodium chloride     PRN Meds:.acetaminophen, DISCONTD:  acetaminophen  Labs and imaging:   CBC  Lab 12/14/11 0958 12/14/11 0040  WBC 7.5 7.2  HGB 13.7 13.5  HCT 39.0 38.6*  PLT 110* CANCELLED BY LAB   BMET/CMET  Lab 12/14/11 0958  NA 136  K 3.6  CL 104  CO2 23  BUN 21  CREATININE 1.40*  CALCIUM 8.4  PROT 6.5  BILITOT 0.6  ALKPHOS 49  ALT 15  AST 23  GLUCOSE 120*   Results for orders placed during the hospital encounter of 12/13/11 (from the past 24 hour(s))  CBC WITH DIFFERENTIAL     Status: Abnormal   Collection Time   12/14/11 12:40 AM      Component Value Range   WBC 7.2  4.0 - 10.5 K/uL   RBC 4.12 (*) 4.22 - 5.81 MIL/uL   Hemoglobin 13.5  13.0 - 17.0 g/dL   HCT 45.4 (*) 09.8 - 11.9 %   MCV 93.7  78.0 - 100.0 fL   MCH 32.8  26.0 - 34.0 pg   MCHC 35.0  30.0 - 36.0 g/dL   RDW 14.7  82.9 - 56.2 %   Platelets CANCELLED BY LAB  150 - 400 K/uL   Neutrophils Relative 94 (*) 43 - 77 %   Neutro Abs  6.8  1.7 - 7.7 K/uL   Lymphocytes Relative 5 (*) 12 - 46 %   Lymphs Abs 0.3 (*) 0.7 - 4.0 K/uL   Monocytes Relative 2 (*) 3 - 12 %   Monocytes Absolute 0.1  0.1 - 1.0 K/uL   Eosinophils Relative 0  0 - 5 %   Eosinophils Absolute 0.0  0.0 - 0.7 K/uL   Basophils Relative 0  0 - 1 %   Basophils Absolute 0.0  0.0 - 0.1 K/uL  URINALYSIS, ROUTINE W REFLEX MICROSCOPIC     Status: Abnormal   Collection Time   12/14/11  5:30 AM      Component Value Range   Color, Urine YELLOW  YELLOW   APPearance CLOUDY (*) CLEAR   Specific Gravity, Urine 1.011  1.005 - 1.030   pH 5.5  5.0 - 8.0   Glucose, UA NEGATIVE  NEGATIVE mg/dL   Hgb urine dipstick MODERATE (*) NEGATIVE   Bilirubin Urine NEGATIVE  NEGATIVE   Ketones, ur NEGATIVE  NEGATIVE mg/dL   Protein, ur NEGATIVE  NEGATIVE mg/dL   Urobilinogen, UA 0.2  0.0 - 1.0 mg/dL   Nitrite POSITIVE (*) NEGATIVE   Leukocytes, UA SMALL (*) NEGATIVE  URINE MICROSCOPIC-ADD ON     Status: Abnormal   Collection Time   12/14/11  5:30 AM      Component Value Range   Squamous Epithelial / LPF RARE  RARE    WBC, UA 3-6  <3 WBC/hpf   RBC / HPF 7-10  <3 RBC/hpf   Bacteria, UA MANY (*) RARE  CBC WITH DIFFERENTIAL     Status: Abnormal   Collection Time   12/14/11  9:58 AM      Component Value Range   WBC 7.5  4.0 - 10.5 K/uL   RBC 4.12 (*) 4.22 - 5.81 MIL/uL   Hemoglobin 13.7  13.0 - 17.0 g/dL   HCT 08.6  57.8 - 46.9 %   MCV 94.7  78.0 - 100.0 fL   MCH 33.3  26.0 - 34.0 pg   MCHC 35.1  30.0 - 36.0 g/dL   RDW 62.9  52.8 - 41.3 %   Platelets 110 (*) 150 - 400 K/uL   Neutrophils Relative 92 (*) 43 - 77 %   Neutro Abs 6.9  1.7 - 7.7 K/uL   Lymphocytes Relative 6 (*) 12 - 46 %   Lymphs Abs 0.4 (*) 0.7 - 4.0 K/uL   Monocytes Relative 2 (*) 3 - 12 %   Monocytes Absolute 0.2  0.1 - 1.0 K/uL   Eosinophils Relative 0  0 - 5 %   Eosinophils Absolute 0.0  0.0 - 0.7 K/uL   Basophils Relative 0  0 - 1 %   Basophils Absolute 0.0  0.0 - 0.1 K/uL  COMPREHENSIVE METABOLIC PANEL     Status: Abnormal   Collection Time   12/14/11  9:58 AM      Component Value Range   Sodium 136  135 - 145 mEq/L   Potassium 3.6  3.5 - 5.1 mEq/L   Chloride 104  96 - 112 mEq/L   CO2 23  19 - 32 mEq/L   Glucose, Bld 120 (*) 70 - 99 mg/dL   BUN 21  6 - 23 mg/dL   Creatinine, Ser 2.44 (*) 0.50 - 1.35 mg/dL   Calcium 8.4  8.4 - 01.0 mg/dL   Total Protein 6.5  6.0 - 8.3 g/dL   Albumin 3.3 (*) 3.5 - 5.2  g/dL   AST 23  0 - 37 U/L   ALT 15  0 - 53 U/L   Alkaline Phosphatase 49  39 - 117 U/L   Total Bilirubin 0.6  0.3 - 1.2 mg/dL   GFR calc non Af Amer 46 (*) >90 mL/min   GFR calc Af Amer 53 (*) >90 mL/min   Dg Chest 2 View  12/14/2011  *RADIOLOGY REPORT*  Clinical Data: Likely pneumonia seen on prior exam.  Cough and fever  CHEST - 2 VIEW  Comparison: 12/14/2011  Findings: Heart size is within normal limits.  Mild ectasia of the thoracic aorta is seen.  The area of questionable infiltrate in the left lower lobe on the portable film  is again seen and suspicious for a posterior basilar left lower lobe early or developing focus  of pneumonia.  The remainder of the lung zones demonstrate prominence of the interstitial markings and some central peribronchial cuffing suggesting underlying bronchitic change.  No other definite focal infiltrates are seen.  No pleural effusion is noted.  Bony structures demonstrate degenerative osteophytosis of the mid and lower thoracic spine and are otherwise intact.  IMPRESSION: Findings suspicious for early or developing posterior segment left lower lobe bronchopneumonia.  Underlying bronchitic change suspected.  Original Report Authenticated By: Bertha Stakes, M.D.   Dg Chest Portable 1 View  12/14/2011  *RADIOLOGY REPORT*  Clinical Data: Fever.  PORTABLE CHEST - 1 VIEW  Comparison: None.  Findings: Cardiomediastinal silhouette is within normal limits.  No pleural effusions.  There is minimal patchy density at the lung bases, left greater than right.  Although the findings may be chronic, early infiltrate is not entirely excluded given the lack of prior films for comparison.  IMPRESSION: Question of early left lower lobe infiltrate.  Original Report Authenticated By: Patterson Hammersmith, M.D.    Assessment  Pt is a 78 y.o. patient with a PMH of tobacco abuse, prostate cancer, HTN, and recurrent UTI's who presented with fever, chills, and cough.  In the ED, LLL infiltrate was noted on chest xray and UA revealed + nitrites, small leukocytes and many bacteria.  Patient was admitted for CAP and possible UTI.  Plan:  1. CAP - 2 view xray was obtained this morning and revealed developing LLL infiltrate. - Patient was initial treated empirically with Ceftriaxone and Azithromycin. - Switching to IV Zosyn today given recent instrumentation and concern for Enterococcus as culprit of UTI. - Zosyn will cover both organisms that cause CAP and will cover Enterococcus.\ - Blood culture pending.  2. UTI - recent instrumentation on 7/31 - Patient's UA was suggestion of infection - +nitrites, small  leukocytes, and many bacteria. - Given recent instrumentation, there is concern for Enterococcus. - Patient switched to IV Zosyn. - Urine Cx Pending.  3. History of Prostate Cancer  - Will obtain PSA today.  3. HTN - Patient is normotensive. - Will continue Lisopril.  4. Tobacco abuse - Smoking cessation counseling.  5. Prostate Hypertrophy/Outlet obstruction - Managed by [facility] urology - Will continue Cardura - Bladder scan today given outlet obstruction  FEN/GI: Continue IVF - NS @ 75 mL/hr.  Will assess oral intake today and may decrease or discontinue IVF. Prophylaxis: SQ Heparin    Disposition: Dispo home pending clinical improvement.  Awaiting urine and blood cultures.   Code Status: Full code.  Everlene Other DO Service Pager: [phone]

## 2011-12-14 NOTE — Progress Notes (Signed)
ANTIBIOTIC CONSULT NOTE - INITIAL  Pharmacy Consult for Zosyn Indication: pneumonia and r/o sepsis  No Known Allergies  Patient Measurements: Height: 5\' 2"  (157.5 cm) Weight: 178 lb 12.7 oz (81.1 kg) IBW/kg (Calculated) : 54.6   Vital Signs: Temp: 99 F (37.2 C) (08/01 1015) Temp src: Oral (08/01 1015) BP: 117/65 mmHg (08/01 1015) Pulse Rate: 74  (08/01 1015) Intake/Output from previous day: 07/31 0701 - 08/01 0700 In: 395 [P.O.:120; I.V.:275] Out: 400 [Urine:400] Intake/Output from this shift:    Labs:  Basename 12/14/11 0958 12/14/11 0040  WBC 7.5 7.2  HGB 13.7 13.5  PLT 110* CANCELLED BY LAB  LABCREA -- --  CREATININE 1.40* --   Estimated Creatinine Clearance: 38.8 ml/min (by C-G formula based on Cr of 1.4). No results found for this basename: VANCOTROUGH:2,VANCOPEAK:2,VANCORANDOM:2,GENTTROUGH:2,GENTPEAK:2,GENTRANDOM:2,TOBRATROUGH:2,TOBRAPEAK:2,TOBRARND:2,AMIKACINPEAK:2,AMIKACINTROU:2,AMIKACIN:2, in the last 72 hours   Microbiology: No results found for this or any previous visit (from the past 720 hour(s)).  Medical History: Past Medical History  Diagnosis Date  . Prostate cancer     Medications:  Prescriptions prior to admission  Medication Sig Dispense Refill  . doxazosin (CARDURA) 2 MG tablet Take 2 mg by mouth daily.      Marland Kitchen lisinopril (PRINIVIL,ZESTRIL) 20 MG tablet Take 20 mg by mouth daily.       Assessment: 78 y/o male patient admitted with fever and rigors requiring broad spectrum antibiotics for pneumonia r/o sepsis.  Plan:  Zosyn 3.375g IV q8h, monitor renal function and f/u c&s.  Verlene Mayer, PharmD, BCPS Pager [phone] 12/14/2011,12:46 PM

## 2011-12-14 NOTE — H&P (Signed)
Seen and examined. Discussed with Dr. Louanne Belton.  Agree with his documentation and management.  Briefly,  78 yo male with fever and chills Issues: Significant fever and rigors.  Both are good evidence of sig bacterial infection and perhaps bacteremia/early sepsis (tachycardia).  Source?  Had subtle findings on port CXR suggesting pneumonia.  Pneumococcal pneumonia can produce such chills.  Will do PA and Lat CXR to confirm infiltrate.  The other possibility is urinary source.  His UA is equivocal.  Culture pending but obtained after antibiotics.  Increasing likelihood of urinary source is that he was seen yesterday by [facility] urology and they had a failed attempt to pass a urinary catheter.  That instrumentation puts him at greater risk for bacteremia. #2 Bladder outlet obstruction.  [facility] uro plans procedure.  Will check creat and bladder scan.

## 2011-12-15 DIAGNOSIS — N390 Urinary tract infection, site not specified: Secondary | ICD-10-CM | POA: Diagnosis not present

## 2011-12-15 DIAGNOSIS — N138 Other obstructive and reflux uropathy: Secondary | ICD-10-CM

## 2011-12-15 LAB — URINE CULTURE
Colony Count: NO GROWTH
Culture: NO GROWTH

## 2011-12-15 LAB — CBC WITH DIFFERENTIAL/PLATELET
Eosinophils Absolute: 0.1 10*3/uL (ref 0.0–0.7)
Eosinophils Relative: 2 % (ref 0–5)
HCT: 35.1 % — ABNORMAL LOW (ref 39.0–52.0)
Hemoglobin: 12.3 g/dL — ABNORMAL LOW (ref 13.0–17.0)
Lymphs Abs: 1.1 10*3/uL (ref 0.7–4.0)
MCH: 33.2 pg (ref 26.0–34.0)
MCHC: 35 g/dL (ref 30.0–36.0)
MCV: 94.9 fL (ref 78.0–100.0)
Monocytes Relative: 7 % (ref 3–12)
Platelets: 93 10*3/uL — ABNORMAL LOW (ref 150–400)
WBC: 3.8 10*3/uL — ABNORMAL LOW (ref 4.0–10.5)

## 2011-12-15 MED ORDER — NICOTINE 14 MG/24HR TD PT24: 1.0000 | MEDICATED_PATCH | Freq: Every day | TRANSDERMAL | Status: AC

## 2011-12-15 MED ORDER — AMOXICILLIN-POT CLAVULANATE 875-125 MG PO TABS
1.0000 | ORAL_TABLET | Freq: Two times a day (BID) | ORAL | Status: DC
  Administered 2011-12-15: 1 via ORAL
  Filled 2011-12-15 (×2): qty 1

## 2011-12-15 MED ORDER — AZITHROMYCIN 500 MG PO TABS: 500.0000 mg | ORAL_TABLET | Freq: Every day | ORAL | Status: AC

## 2011-12-15 MED ORDER — AMOXICILLIN-POT CLAVULANATE 875-125 MG PO TABS: 1.0000 | ORAL_TABLET | Freq: Two times a day (BID) | ORAL | Status: AC

## 2011-12-15 MED ORDER — AMOXICILLIN-POT CLAVULANATE 875-125 MG PO TABS: 1.0000 | ORAL_TABLET | Freq: Two times a day (BID) | ORAL | Status: DC

## 2011-12-15 MED ORDER — MENTHOL 3 MG MT LOZG: 1.0000 | LOZENGE | OROMUCOSAL | Status: DC | PRN

## 2011-12-15 MED ORDER — NICOTINE 14 MG/24HR TD PT24: 1.0000 | MEDICATED_PATCH | Freq: Every day | TRANSDERMAL | Status: DC

## 2011-12-15 MED ORDER — AZITHROMYCIN 500 MG PO TABS
500.0000 mg | ORAL_TABLET | Freq: Every day | ORAL | Status: DC
  Administered 2011-12-15: 500 mg via ORAL
  Filled 2011-12-15: qty 1

## 2011-12-15 MED ORDER — AZITHROMYCIN 500 MG PO TABS: 500.0000 mg | ORAL_TABLET | Freq: Every day | ORAL | Status: DC

## 2011-12-15 NOTE — Discharge Summary (Signed)
Family Medicine Teaching [facility] Discharge Summary  Patient name: Jack Thornton Medical record number: [id number] Date of birth: 11-Jan-1930 Age: 78 y.o. Gender: male Date of Admission: 12/13/2011  Date of Discharge:12/15/11  Admitting Physician: Sanjuana Letters, MD  Primary Care Provider: [name]  Admission Diagnosis: Fever, chills  Discharge Diagnoses: Patient Active Problem List  Diagnosis  . Hypertension  . Community acquired bacterial pneumonia  . Tobacco abuse  . UTI (lower urinary tract infection)  . Prostatic hypertrophy, benign, with obstruction   Consultations: None  Significant Labs and Imaging:  Results for orders placed during the hospital encounter of 12/13/11 (from the past 48 hour(s))  CBC WITH DIFFERENTIAL     Status: Abnormal   Collection Time   12/14/11 12:40 AM      Component Value Range Comment   WBC 7.2  4.0 - 10.5 K/uL    RBC 4.12 (*) 4.22 - 5.81 MIL/uL    Hemoglobin 13.5  13.0 - 17.0 g/dL    HCT 14.7 (*) 82.9 - 52.0 %    MCV 93.7  78.0 - 100.0 fL    MCH 32.8  26.0 - 34.0 pg    MCHC 35.0  30.0 - 36.0 g/dL    RDW 56.2  13.0 - 86.5 %    Platelets CANCELLED BY LAB  150 - 400 K/uL WRONG MRN, REORDERED [id number]   Neutrophils Relative 94 (*) 43 - 77 %    Neutro Abs 6.8  1.7 - 7.7 K/uL    Lymphocytes Relative 5 (*) 12 - 46 %    Lymphs Abs 0.3 (*) 0.7 - 4.0 K/uL    Monocytes Relative 2 (*) 3 - 12 %    Monocytes Absolute 0.1  0.1 - 1.0 K/uL    Eosinophils Relative 0  0 - 5 %    Eosinophils Absolute 0.0  0.0 - 0.7 K/uL    Basophils Relative 0  0 - 1 %    Basophils Absolute 0.0  0.0 - 0.1 K/uL   URINALYSIS, ROUTINE W REFLEX MICROSCOPIC     Status: Abnormal   Collection Time   12/14/11  5:30 AM      Component Value Range Comment   Color, Urine YELLOW  YELLOW    APPearance CLOUDY (*) CLEAR    Specific Gravity, Urine 1.011  1.005 - 1.030    pH 5.5  5.0 - 8.0    Glucose, UA NEGATIVE  NEGATIVE mg/dL    Hgb urine dipstick MODERATE (*) NEGATIVE      Bilirubin Urine NEGATIVE  NEGATIVE    Ketones, ur NEGATIVE  NEGATIVE mg/dL    Protein, ur NEGATIVE  NEGATIVE mg/dL    Urobilinogen, UA 0.2  0.0 - 1.0 mg/dL    Nitrite POSITIVE (*) NEGATIVE    Leukocytes, UA SMALL (*) NEGATIVE   URINE CULTURE     Status: Normal   Collection Time   12/14/11  5:30 AM      Component Value Range Comment   Specimen Description URINE, CATHETERIZED      Special Requests NONE      Culture  Setup Time 12/14/2011 05:45      Colony Count NO GROWTH      Culture NO GROWTH      Report Status 12/15/2011 FINAL     URINE MICROSCOPIC-ADD ON     Status: Abnormal   Collection Time   12/14/11  5:30 AM      Component Value Range Comment   Squamous Epithelial / LPF  RARE  RARE    WBC, UA 3-6  <3 WBC/hpf    RBC / HPF 7-10  <3 RBC/hpf    Bacteria, UA MANY (*) RARE   CBC WITH DIFFERENTIAL     Status: Abnormal   Collection Time   12/14/11  9:58 AM      Component Value Range Comment   WBC 7.5  4.0 - 10.5 K/uL    RBC 4.12 (*) 4.22 - 5.81 MIL/uL    Hemoglobin 13.7  13.0 - 17.0 g/dL    HCT 11.9  14.7 - 82.9 %    MCV 94.7  78.0 - 100.0 fL    MCH 33.3  26.0 - 34.0 pg    MCHC 35.1  30.0 - 36.0 g/dL    RDW 56.2  13.0 - 86.5 %    Platelets 110 (*) 150 - 400 K/uL PLATELET COUNT CONFIRMED BY SMEAR   Neutrophils Relative 92 (*) 43 - 77 %    Neutro Abs 6.9  1.7 - 7.7 K/uL    Lymphocytes Relative 6 (*) 12 - 46 %    Lymphs Abs 0.4 (*) 0.7 - 4.0 K/uL    Monocytes Relative 2 (*) 3 - 12 %    Monocytes Absolute 0.2  0.1 - 1.0 K/uL    Eosinophils Relative 0  0 - 5 %    Eosinophils Absolute 0.0  0.0 - 0.7 K/uL    Basophils Relative 0  0 - 1 %    Basophils Absolute 0.0  0.0 - 0.1 K/uL   COMPREHENSIVE METABOLIC PANEL     Status: Abnormal   Collection Time   12/14/11  9:58 AM      Component Value Range Comment   Sodium 136  135 - 145 mEq/L    Potassium 3.6  3.5 - 5.1 mEq/L    Chloride 104  96 - 112 mEq/L    CO2 23  19 - 32 mEq/L    Glucose, Bld 120 (*) 70 - 99 mg/dL    BUN 21  6 - 23  mg/dL    Creatinine, Ser 7.84 (*) 0.50 - 1.35 mg/dL    Calcium 8.4  8.4 - 69.6 mg/dL    Total Protein 6.5  6.0 - 8.3 g/dL    Albumin 3.3 (*) 3.5 - 5.2 g/dL    AST 23  0 - 37 U/L    ALT 15  0 - 53 U/L    Alkaline Phosphatase 49  39 - 117 U/L    Total Bilirubin 0.6  0.3 - 1.2 mg/dL    GFR calc non Af Amer 46 (*) >90 mL/min    GFR calc Af Amer 53 (*) >90 mL/min   CBC WITH DIFFERENTIAL     Status: Abnormal   Collection Time   12/15/11  5:20 AM      Component Value Range Comment   WBC 3.8 (*) 4.0 - 10.5 K/uL    RBC 3.70 (*) 4.22 - 5.81 MIL/uL    Hemoglobin 12.3 (*) 13.0 - 17.0 g/dL    HCT 29.5 (*) 28.4 - 52.0 %    MCV 94.9  78.0 - 100.0 fL    MCH 33.2  26.0 - 34.0 pg    MCHC 35.0  30.0 - 36.0 g/dL    RDW 13.2  44.0 - 10.2 %    Platelets 93 (*) 150 - 400 K/uL CONSISTENT WITH PREVIOUS RESULT   Neutrophils Relative 61  43 - 77 %    Neutro Abs 2.3  1.7 - 7.7 K/uL    Lymphocytes Relative 30  12 - 46 %    Lymphs Abs 1.1  0.7 - 4.0 K/uL    Monocytes Relative 7  3 - 12 %    Monocytes Absolute 0.3  0.1 - 1.0 K/uL    Eosinophils Relative 2  0 - 5 %    Eosinophils Absolute 0.1  0.0 - 0.7 K/uL    Basophils Relative 1  0 - 1 %    Basophils Absolute 0.0  0.0 - 0.1 K/uL     Procedures: None   Brief Hospital Course:  78 year old male with a PMH of tobacco abuse, prostate cancer, HTN, and recurrent UTI's presented to the ED on 8/1 with fever and chills x 1 day and history of cough.    1) CAP Given patient symptoms, chest xray was obtained and revealed possible early LLL infiltrate.  CBC revealed normal WBC count but a left shift with 94 % neutrophils.   Patient was started on empiric IV Ceftriaxone and Azithromycin.  Repeat chest xray (2 view) was later obtained given that initial xray was only one view and questionable nature of infiltrate.  It revealed early LLL bronchopneumonia.  Antibiotics were switched to IV Zosyn (8/1 2 pm) to cover for potential enterococcus UTI (see below).  Patient did  well on IV antibiotics with notable clinical improvement and cessation of fever and chills.  Patient was discharged on Azithromycin.   2) UTI UA was obtained given fever, chills and history of recurrent UTI's.  UA was remarkable for small leukocytes, positive nitrites, and many bacteria.  Late morning of 8/1, patient reported recent Urology appointment and failed attempt to pass a urinary catheter.  This prompted switching antibiotic coverage to IV Zosyn, which covers pneumonia as well as potential enterococcus UTI.   Urine culture was later obtained, but after antibiotics were started.  Culture returned on 8/2 and revealed no growth.  Patient reported urinary symptoms (urethral pain/burning) on 8/2.  Given symptoms and culture being obtained after antibiotics, patient was diagnosed with UTI.  He was discharged on PO amoxicillin.   3) HTN Patient was continued on home lisinopril.  Patient was normotensive during hospitalization.  4) Prostatic Hypertrophy with obstruction Patient was continued on home Cardura during hospitalization. Patient also has history of prostate cancer.  Followed/managed by [facility] urology.  5) Tobacco abuse Patient was counseled on smoking cessation during hospitalization.  Patient was discharge on Nicotine patch.   Discharge Medications:  Medication List  As of 12/15/2011  9:57 PM   TAKE these medications         amoxicillin-clavulanate 875-125 MG per tablet   Commonly known as: AUGMENTIN   Take 1 tablet by mouth every 12 (twelve) hours.      azithromycin 500 MG tablet   Commonly known as: ZITHROMAX   Take 1 tablet (500 mg total) by mouth daily.      doxazosin 2 MG tablet   Commonly known as: CARDURA   Take 2 mg by mouth daily.      lisinopril 20 MG tablet   Commonly known as: PRINIVIL,ZESTRIL   Take 20 mg by mouth daily.      menthol-cetylpyridinium 3 MG lozenge   Commonly known as: CEPACOL   Take 1 lozenge (3 mg total) by mouth as needed for pain.       nicotine 14 mg/24hr patch   Commonly known as: NICODERM CQ - dosed in mg/24 hours   Place  1 patch onto the skin daily with breakfast.           Issues for Follow Up:  1) Smoking status needs to be assessed.  Patient was discharge on nicotine patch. 2) Patient needs to follow up with [facility] urology.  3) Completion of antibiotics.  Outstanding Results: None  Discharge Instructions: Please refer to Patient Instructions section of EMR for full details.  Patient was counseled important signs and symptoms that should prompt return to medical care, changes in medications, dietary instructions, activity restrictions, and follow up appointments.    Follow-up Information    Follow up with [name], MD on 12/18/2011. (at 9:15 am - please arrive 10 minutes prior to appointment)    [address]:   [address] [place] [address] [phone]       Follow up with [facility] Urology. Schedule an appointment as soon as possible for a visit in 1 week. (hospital follow-up for ?urinary tract infection)    Contact information:   [phone]         Discharge Condition: Patient was stable and doing well at time of discharge.   Everlene Other, DO 12/15/2011, 9:57 PM

## 2011-12-15 NOTE — Progress Notes (Signed)
FMTS Attending Daily Note:  Jack Don MD  [phone] pager  Family Practice pager:  [phone] I have seen and examined this patient and have reviewed their chart. I have discussed this patient with the resident. I agree with the resident's findings, assessment and care plan.  Patient much improved.  DC home today on Augmentin to cover both lung and urine flora.  Lung exam clear today.  Also will DC home with nicotine patch and FU on Monday to assess for continued improvement and discuss complete smoking cessation at that point.

## 2011-12-15 NOTE — Progress Notes (Signed)
PGY-1 Daily Progress Note Family Medicine Teaching Service Simone Curia, MD Service Pager: [phone]   Subjective:  Pt did well overnight and today feels okay - denies SOB, nausea/vomiting, but reports some cough x 4 weeks that comes and goes, sore throat, urethral burning (traumatic cath at [facility] urology yesterday), and requesting a nicotine patch.  Taking good PO, only eating after sun goes down for religious reasons.  Objective:  VITALS Temp:  [97.3 F (36.3 C)-98.7 F (37.1 C)] 98.1 F (36.7 C) (08/02 4540) Pulse Rate:  [63-83] 68  (08/02 0638) Resp:  [20] 20  (08/02 0638) BP: (96-118)/(55-62) 111/60 mmHg (08/02 0638) SpO2:  [95 %-99 %] 99 % (08/02 9811)  In/Out  Intake/Output Summary (Last 24 hours) at 12/15/11 1027 Last data filed at 12/15/11 0600  Gross per 24 hour  Intake   1175 ml  Output    950 ml  Net    225 ml    Physical Exam: Gen:  Alert and awake. NAD.  Sitting at edge of bed. HEENT: Moist mucous membranes.  Right eye with corneal clouding.  Oropharynx clear. CV: Regular rate and rhythm, no murmurs rubs or gallops.  2+ radial pulse bilaterally PULM: clear to auscultation bilaterally. No rales, rhonchi, or wheeze.  No increased WOB. ABD: soft, mildly TTP RLQ, nondistended.  +BS. EXT: No edema noted.   MEDS   Scheduled Meds:    . doxazosin  2 mg Oral Daily  . heparin  5,000 Units Subcutaneous Q8H  . lisinopril  20 mg Oral Daily  . nicotine  14 mg Transdermal Q breakfast  . ondansetron      . piperacillin-tazobactam (ZOSYN)  IV  3.375 g Intravenous Q8H  . DISCONTD: azithromycin  500 mg Oral Q24H  . DISCONTD: cefTRIAXone (ROCEPHIN)  IV  1 g Intravenous Q24H   Continuous Infusions:    . sodium chloride 75 mL/hr at 12/15/11 1005   PRN Meds:.acetaminophen, menthol-cetylpyridinium  Labs and imaging:   CBC  Lab 12/15/11 0520 12/14/11 0958 12/14/11 0040  WBC 3.8* 7.5 7.2  HGB 12.3* 13.7 13.5  HCT 35.1* 39.0 38.6*  PLT 93* 110* CANCELLED BY LAB     BMET/CMET  Lab 12/14/11 0958  NA 136  K 3.6  CL 104  CO2 23  BUN 21  CREATININE 1.40*  CALCIUM 8.4  PROT 6.5  BILITOT 0.6  ALKPHOS 49  ALT 15  AST 23  GLUCOSE 120*   Results for orders placed during the hospital encounter of 12/13/11 (from the past 24 hour(s))  CBC WITH DIFFERENTIAL     Status: Abnormal   Collection Time   12/15/11  5:20 AM      Component Value Range   WBC 3.8 (*) 4.0 - 10.5 K/uL   RBC 3.70 (*) 4.22 - 5.81 MIL/uL   Hemoglobin 12.3 (*) 13.0 - 17.0 g/dL   HCT 91.4 (*) 78.2 - 95.6 %   MCV 94.9  78.0 - 100.0 fL   MCH 33.2  26.0 - 34.0 pg   MCHC 35.0  30.0 - 36.0 g/dL   RDW 21.3  08.6 - 57.8 %   Platelets 93 (*) 150 - 400 K/uL   Neutrophils Relative 61  43 - 77 %   Neutro Abs 2.3  1.7 - 7.7 K/uL   Lymphocytes Relative 30  12 - 46 %   Lymphs Abs 1.1  0.7 - 4.0 K/uL   Monocytes Relative 7  3 - 12 %   Monocytes Absolute 0.3  0.1 -  1.0 K/uL   Eosinophils Relative 2  0 - 5 %   Eosinophils Absolute 0.1  0.0 - 0.7 K/uL   Basophils Relative 1  0 - 1 %   Basophils Absolute 0.0  0.0 - 0.1 K/uL   Dg Chest 2 View  12/14/2011  *RADIOLOGY REPORT*  Clinical Data: Likely pneumonia seen on prior exam.  Cough and fever  CHEST - 2 VIEW  Comparison: 12/14/2011  Findings: Heart size is within normal limits.  Mild ectasia of the thoracic aorta is seen.  The area of questionable infiltrate in the left lower lobe on the portable film  is again seen and suspicious for a posterior basilar left lower lobe early or developing focus of pneumonia.  The remainder of the lung zones demonstrate prominence of the interstitial markings and some central peribronchial cuffing suggesting underlying bronchitic change.  No other definite focal infiltrates are seen.  No pleural effusion is noted.  Bony structures demonstrate degenerative osteophytosis of the mid and lower thoracic spine and are otherwise intact.  IMPRESSION: Findings suspicious for early or developing posterior segment left lower lobe  bronchopneumonia.  Underlying bronchitic change suspected.  Original Report Authenticated By: Bertha Stakes, M.D.   Dg Chest Portable 1 View  12/14/2011  *RADIOLOGY REPORT*  Clinical Data: Fever.  PORTABLE CHEST - 1 VIEW  Comparison: None.  Findings: Cardiomediastinal silhouette is within normal limits.  No pleural effusions.  There is minimal patchy density at the lung bases, left greater than right.  Although the findings may be chronic, early infiltrate is not entirely excluded given the lack of prior films for comparison.  IMPRESSION: Question of early left lower lobe infiltrate.  Original Report Authenticated By: Patterson Hammersmith, M.D.    Assessment  This is an 78 y.o. patient with a PMH of tobacco abuse, prostate cancer, HTN, and recurrent UTI's who presented with fever, chills, and cough. LLL infiltrate was noted on chest xray and UA revealed + nitrites, small leukocytes and many bacteria.  Patient was admitted for CAP and possible UTI.  Today, negative urine culture.  Plan:  1. CAP - 2 view xray 8/1 revealed developing LLL infiltrate.  Started on CTX and azithro for empiric tx, switched 8/1 to IV zosyn given recent instrumentation to cover for enterococcus UTI empirically; pt shows improvement in WBC and no fevers >24 hrs today with clinical improvement. - Pt taking good PO today; AM transitioned to augmentin 875-125 1 tab PO Q12 hours (complete a 7d course) and azithromycin 500mg  PO daily (complete a 3 day course); d/c'ed IV zosyn  2. UTI - recent instrumentation on 7/3, UA with +nitrites, small leukocytes, and many bacteria, IV zosyn 8/1 - Given recent instrumentation, there is concern for Enterococcus - Though Urine Cx negative, with continued urethral burning and culture obtained after 1 dose of abx, continue to treat; switched from zosyn to augmentin today, to complete a 7 day course (per above)  3. History of Prostate Cancer  - f/u with [facility] urology  3. HTN - Patient is  normotensive. - Will continue Lisopril.  4. Tobacco abuse - Smoking cessation counseling. - Nicotine patch - continue on discharge  5. Prostate Hypertrophy/Outlet obstruction - Managed by [facility] urology - Will continue Cardura - F/u bladder scan ordered yesterday  FEN/GI: IVF - NS @ 75 mL/hr.  Pt with good PO fluid intake - consider d/c IVF in preparation for discharge  Prophylaxis: SQ Heparin    Disposition: Dispo home pending continued clinical improvement  with PO abx, later this afternoon.  F/u pending blood cultures as outpatient, as pt stable; discharge with nicotine patch, f/u with [facility] urology, continued PO abx. Code Status: Full code.  Simone Curia, MD Service Pager: [phone]

## 2011-12-15 NOTE — Progress Notes (Signed)
Pt's son given discharge instructions.  He translated to the patient all information that was given.  They both verbalized understanding of instructions, meds, and follow-up.  D/C out via wheelchair with son. Sol Blazing Ward

## 2011-12-16 NOTE — Discharge Summary (Signed)
Family Medicine Teaching Service  Discharge Note : Attending Renold Don MD Pager [phone] Inpatient Team Pager:  [phone]  I have seen and examined this patient, reviewed their chart and discussed discharge planning wit the resident at the time of discharge. I agree with the discharge plan as above.  Discharge exam: Physical Exam:  Gen: Alert. NAD.  CV: Regular rate and rhythm, no murmurs rubs or gallops.  PULM: clear to auscultation bilaterally. No rales, rhonchi, or wheeze.  ABD: soft, nontender. mildy distended.  EXT: No edema, erythema, or tenderness to palpation in bilateral lower extremities.  Neuro: No focal deficits, awake and alert.

## 2011-12-18 ENCOUNTER — Encounter: Payer: Self-pay | Admitting: Family Medicine

## 2011-12-18 ENCOUNTER — Ambulatory Visit (INDEPENDENT_AMBULATORY_CARE_PROVIDER_SITE_OTHER): Payer: Self-pay | Admitting: Family Medicine

## 2011-12-18 VITALS — BP 116/69 | HR 96 | Temp 98.6°F | Wt 179.0 lb

## 2011-12-18 DIAGNOSIS — J189 Pneumonia, unspecified organism: Secondary | ICD-10-CM

## 2011-12-18 DIAGNOSIS — F17200 Nicotine dependence, unspecified, uncomplicated: Secondary | ICD-10-CM

## 2011-12-18 DIAGNOSIS — N390 Urinary tract infection, site not specified: Secondary | ICD-10-CM

## 2011-12-18 MED ORDER — NICOTINE 21 MG/24HR TD PT24: 1.0000 | MEDICATED_PATCH | TRANSDERMAL | Status: DC

## 2011-12-18 NOTE — Patient Instructions (Addendum)
Dear Laurence Slate,   It was great to see you today. Thank you for coming to clinic. Please read below regarding the issues that we discussed.   1. I am glad you are no longer having fever and chills. If these were to develop again, come back to see [place] as soon as possible. If you are feeling poorly with the fever, you can go to urgent care or emergency room.  2. I will send in a prescription for nicotine patches. I would like you to try to quit smoking completely. If you are having trouble with this, we can have you see Dr. Raymondo Band who works with people to help them quit.  3. Blood pressure looks good today.  4. Please keep your appointment with the urologist at [facility].   Please follow up in clinic in 4 weeks to follow up on smoking. Please call earlier if you have any questions or concerns.   Sincerely,  Dr. [name]

## 2011-12-19 DIAGNOSIS — J189 Pneumonia, unspecified organism: Secondary | ICD-10-CM | POA: Insufficient documentation

## 2011-12-19 NOTE — Progress Notes (Signed)
Subjective:  Hospital Follow up for Community acquired pneumonia and UTI  1. Pneumonia-patient does continue to have some slight SOB but mainly with going up stairs. He has several day of Augmentin left but has been taking regularly. Completed azithromycin. No fevers/chills/nausea/vomiting  2. TObacco abuse-patient down to 5 cigarettes per day from a peak of 1.5 PPD. Main reason is for religious holiday at this time and can't smoke in daytime. States that nicotine patches would help and does have goal to quit.   3. UTI-once again several days of Augmentin left. Has urology follow up on 8/13. No fevers/chills/CVA tenderness. Patient does say he still has some burning with urination but it continues to improve. No polyuria. Once again urine culture obtained after antibiotics started so not helpful in tailoring therapy.   ROS--See HPI  Past Medical History-smoking status noted: current smoker.  Reviewed problem list.  Medications- reviewed and updated Chief complaint-noted  Objective:  Gen: NAD CV: RRR no mrg Lungs: CTAB, no wheezes Abd: soft, nontender nondistended MSK: no edema Skin: warm, dry   Assessment/Plan: See problem oriented charted

## 2011-12-19 NOTE — Assessment & Plan Note (Signed)
Patient is to complete course of augmentin. If pain worsens or does not continue to improve or fever recurs, is to return. Otherwise, to keep urology appointment on 13th.

## 2011-12-19 NOTE — Assessment & Plan Note (Signed)
Hospital follow up-continues to improve. To complete course of antibiotics.

## 2011-12-19 NOTE — Assessment & Plan Note (Signed)
Encouraged cessation. Patient will try nicotine patch at 21 mg/24 hour for 1 month then follow up and we will decrease as tolerated with goal of complete cessation.

## 2011-12-20 LAB — CULTURE, BLOOD (ROUTINE X 2): Culture: NO GROWTH

## 2011-12-22 ENCOUNTER — Encounter: Payer: Self-pay | Admitting: Family Medicine

## 2012-01-04 ENCOUNTER — Telehealth: Payer: Self-pay | Admitting: *Deleted

## 2012-01-04 ENCOUNTER — Ambulatory Visit (INDEPENDENT_AMBULATORY_CARE_PROVIDER_SITE_OTHER): Payer: Self-pay | Admitting: *Deleted

## 2012-01-04 DIAGNOSIS — N390 Urinary tract infection, site not specified: Secondary | ICD-10-CM

## 2012-01-04 NOTE — Progress Notes (Signed)
Patient and son in office. The catheter tubing is secure .  Foley is secure and in place.The problem is a device to keep the  tubing secure on the leg is broken and and the tubing will not stay in place .  Advised the only suggestion is to tape in place and this is done for patient. Son has done this before also and states it will not stay.  Sometimes has  a little leaking around catheter . Catheter seems to be draining well at this time and son states it has been draining well at home. He has called urologist and is waiting for a call back from them.Marland Kitchen

## 2012-01-04 NOTE — Telephone Encounter (Signed)
Received call from son stating he needs help with catheter because it is leaking.  Catheter was placed by urologist on 08/13 . Advised that he will need to contact urologist about this. However he states it is just  changing  the bag that he needs help with . Advised he can come to office at 1:30 and will show him how to change bag.

## 2012-01-14 ENCOUNTER — Encounter: Payer: Self-pay | Admitting: Family Medicine

## 2012-01-14 DIAGNOSIS — N35919 Unspecified urethral stricture, male, unspecified site: Secondary | ICD-10-CM | POA: Insufficient documentation

## 2012-01-17 ENCOUNTER — Ambulatory Visit (INDEPENDENT_AMBULATORY_CARE_PROVIDER_SITE_OTHER): Payer: Self-pay | Admitting: Family Medicine

## 2012-01-17 ENCOUNTER — Encounter: Payer: Self-pay | Admitting: Family Medicine

## 2012-01-17 VITALS — BP 153/75 | HR 73 | Temp 98.5°F | Ht 62.0 in | Wt 186.0 lb

## 2012-01-17 DIAGNOSIS — F17200 Nicotine dependence, unspecified, uncomplicated: Secondary | ICD-10-CM

## 2012-01-17 DIAGNOSIS — H8110 Benign paroxysmal vertigo, unspecified ear: Secondary | ICD-10-CM

## 2012-01-17 DIAGNOSIS — I10 Essential (primary) hypertension: Secondary | ICD-10-CM

## 2012-01-17 DIAGNOSIS — R42 Dizziness and giddiness: Secondary | ICD-10-CM

## 2012-01-17 MED ORDER — MECLIZINE HCL 25 MG PO TABS: 25.0000 mg | ORAL_TABLET | Freq: Three times a day (TID) | ORAL | Status: AC | PRN

## 2012-01-17 MED ORDER — NICOTINE 7 MG/24HR TD PT24: 1.0000 | MEDICATED_PATCH | TRANSDERMAL | Status: AC

## 2012-01-17 MED ORDER — MECLIZINE HCL 25 MG PO CHEW: 1.0000 | CHEWABLE_TABLET | Freq: Three times a day (TID) | ORAL | Status: DC | PRN

## 2012-01-17 NOTE — Patient Instructions (Addendum)
Also follow up with me in 1 month to follow up on vertigo and smoking.   Benign Positional Vertigo Vertigo means you feel like you or your surroundings are moving when they are not. Benign positional vertigo is the most common form of vertigo. Benign means that the cause of your condition is not serious. Benign positional vertigo is more common in older adults. CAUSES  Benign positional vertigo is the result of an upset in the labyrinth system. This is an area in the middle ear that helps control your balance. This may be caused by a viral infection, head injury, or repetitive motion. However, often no specific cause is found. SYMPTOMS  Symptoms of benign positional vertigo occur when you move your head or eyes in different directions. Some of the symptoms may include:  Loss of balance and falls.   Vomiting.   Blurred vision.   Dizziness.   Nausea.   Involuntary eye movements (nystagmus).  DIAGNOSIS  Benign positional vertigo is usually diagnosed by physical exam. If the specific cause of your benign positional vertigo is unknown, your caregiver may perform imaging tests, such as magnetic resonance imaging (MRI) or computed tomography (CT). TREATMENT  Your caregiver may recommend movements or procedures to correct the benign positional vertigo. Medicines such as meclizine, benzodiazepines, and medicines for nausea may be used to treat your symptoms. In rare cases, if your symptoms are caused by certain conditions that affect the inner ear, you may need surgery. HOME CARE INSTRUCTIONS   Follow your caregiver's instructions.   Move slowly. Do not make sudden body or head movements.   Avoid driving.   Avoid operating heavy machinery.   Avoid performing any tasks that would be dangerous to you or others during a vertigo episode.   Drink enough fluids to keep your urine clear or pale yellow.  SEEK IMMEDIATE MEDICAL CARE IF:   You develop problems with walking, weakness, numbness, or  using your arms, hands, or legs.   You have difficulty speaking.   You develop severe headaches.   Your nausea or vomiting continues or gets worse.   You develop visual changes.   Your family or friends notice any behavioral changes.   Your condition gets worse.   You have a fever.   You develop a stiff neck or sensitivity to light.  MAKE SURE YOU:   Understand these instructions.   Will watch your condition.   Will get help right away if you are not doing well or get worse.  Document Released: 02/06/2006 Document Revised: 04/20/2011 Document Reviewed: 01/19/2011 [facility] Patient Information 2012 [place], [place].

## 2012-01-18 DIAGNOSIS — H8110 Benign paroxysmal vertigo, unspecified ear: Secondary | ICD-10-CM | POA: Insufficient documentation

## 2012-01-18 NOTE — Progress Notes (Signed)
Subjective:   1. Tobacco abuse follow up-patient down to 1/2 PPD from full PPD. Did not get patch after last visit because didn't think he could afford it. Still willing to quit and actively trying but needs help.   2. Vertigo-for last 2 weeks patient has noted room spinning with position change. He does not occasional tinnitus but not always associated with vertigo. Lasts for about a minute and typically worst if trying to lay down in bed. Some nausea but minimal. No headache. No hearing loss. Does not happen with every quick movement. Minimal effects on day to day function. Has not fallen and does not believe he will fall due to it.   ROS--See HPI  Past Medical History-smoking status noted: BPPV, HTN, tobacco abuse, urethral stricture.  Reviewed problem list.  Medications- reviewed and updated Chief complaint-noted  Objective: BP 153/75  Pulse 73  Temp 98.5 F (36.9 C) (Oral)  Ht 5\' 2"  (1.575 m)  Wt 186 lb (84.369 kg)  BMI 34.02 kg/m2 Gen: NAD CV: RRR no mrg Lungs: CTAB Ears: TM with normal light reflex, nonerythematous Neuro: normal gait, CN II-XII intact, intact sensation, intact coordination, 2+ reflexes at knees.  Maneuvers: dix-hallpike negative for nystagmus or reproducing symptoms.   Assessment/Plan: See problem oriented charted

## 2012-01-18 NOTE — Assessment & Plan Note (Signed)
Suspect minor case of BPPV. Tinnitus could suggest Meniere's but not always related to vertigo and no hearing loss/changes (patient hears well symmetrically). Will trial rx of meclizine for acute period. If not improved, would consider vestibular therapy but will not pursue at this time as symptoms are minimal.   Considered CVA/stroke given long term smoking and HTN but as mentioned above, symptoms minimal and would suspect more severe if related to CVA/stroke. Normal neuro exam as well reassuring for no central cause.

## 2012-01-18 NOTE — Assessment & Plan Note (Signed)
Patient down to 1/2 PPD. Will try 7mg  patches for now and follow up in 1 month.

## 2012-01-18 NOTE — Assessment & Plan Note (Signed)
Elevated isolated systolic today. Will monitor for now as previously well controlled. Additionally, do not want to cause low BP and orthostatic hypotension while dealing with vertigo.

## 2012-02-14 ENCOUNTER — Encounter: Payer: Self-pay | Admitting: Family Medicine

## 2012-02-14 ENCOUNTER — Ambulatory Visit (INDEPENDENT_AMBULATORY_CARE_PROVIDER_SITE_OTHER): Payer: Self-pay | Admitting: Family Medicine

## 2012-02-14 ENCOUNTER — Ambulatory Visit (HOSPITAL_COMMUNITY)
Admission: RE | Admit: 2012-02-14 | Discharge: 2012-02-14 | Disposition: A | Discharge status: Home / Self Care | Payer: Self-pay | Source: Ambulatory Visit | Attending: Family Medicine | Admitting: Family Medicine

## 2012-02-14 VITALS — BP 121/64 | HR 80 | Temp 98.9°F | Wt 183.1 lb

## 2012-02-14 DIAGNOSIS — R059 Cough, unspecified: Secondary | ICD-10-CM | POA: Insufficient documentation

## 2012-02-14 DIAGNOSIS — R05 Cough: Secondary | ICD-10-CM | POA: Insufficient documentation

## 2012-02-14 DIAGNOSIS — J40 Bronchitis, not specified as acute or chronic: Secondary | ICD-10-CM

## 2012-02-14 MED ORDER — DOXYCYCLINE HYCLATE 100 MG PO TABS: 100.0000 mg | ORAL_TABLET | Freq: Two times a day (BID) | ORAL | Status: DC

## 2012-02-14 MED ORDER — CIPROFLOXACIN HCL 500 MG PO TABS: 500.0000 mg | ORAL_TABLET | Freq: Two times a day (BID) | ORAL | Status: DC

## 2012-02-14 MED ORDER — CEFTRIAXONE SODIUM 1 G IJ SOLR
2.0000 g | Freq: Once | INTRAMUSCULAR | Status: AC
  Administered 2012-02-14: 2 g via INTRAMUSCULAR

## 2012-02-14 NOTE — Patient Instructions (Signed)
I want you to start taking 2 antibiotics.  You will take both of them two times per day. Please go over to the hospital and get a chest x-ray. Come back to see [place] in 2 days.

## 2012-02-14 NOTE — Addendum Note (Signed)
Addended by: Farrell Ours on: 02/14/2012 12:22 PM   Modules accepted: Orders

## 2012-02-14 NOTE — Progress Notes (Signed)
Patient ID: Malacai Grantz, male   DOB: 1929/09/20, 78 y.o.   MRN: [id number] Subjective: The patient is a 78 y.o. year old male who presents today for cough.  Cough has been present for ~2 months.  Cough is productive.  Some subjective chills but no fevers.  No increase in shortness of breath.  No n/v/d.  Occasional constipation.  No chest pain.  Patient's past medical, social, and family history were reviewed and updated as appropriate. History  Substance Use Topics  . Smoking status: Current Every Day Smoker -- 1.0 packs/day for 50 years    Types: Cigarettes  . Smokeless tobacco: Never Used  . Alcohol Use: No   Objective:  Filed Vitals:   02/14/12 0932  BP: 121/64  Pulse: 80  Temp: 98.9 F (37.2 C)   Gen: NAD, interactive CV: RRR Resp: Coarse breath sounds throughout with some ronchi in the RLL Ext: No edema  Assessment/Plan:  Please also see individual problems in problem list for problem-specific plans.

## 2012-02-14 NOTE — Assessment & Plan Note (Signed)
Patient is a long-term smoker with signs/symptoms of at least bronchitis.  Cannot clinically rule out pneumonia.  If is pneumonia he is HCAP due to hospitalization in July.  Will give 2gm Rocephin, start on cipro/doxy, and obtain CXR.  Advised of red flags to prompt return.  Recommended close f/u in 2 days to make sure is improving.  Patient is currently afebrile with no increased WOB and no O2 requirement, accordingly I do not see any reason for hospitalization.

## 2012-02-15 ENCOUNTER — Telehealth (HOSPITAL_COMMUNITY): Payer: Self-pay | Admitting: Family Medicine

## 2012-02-15 NOTE — Telephone Encounter (Signed)
LVM stating x-ray looks fine.  Rec patient continue current abx and keep f/u appointment.

## 2012-02-16 ENCOUNTER — Ambulatory Visit (INDEPENDENT_AMBULATORY_CARE_PROVIDER_SITE_OTHER): Payer: Self-pay | Admitting: Family Medicine

## 2012-02-16 ENCOUNTER — Encounter: Payer: Self-pay | Admitting: Family Medicine

## 2012-02-16 VITALS — BP 128/64 | HR 77 | Temp 99.3°F | Wt 185.6 lb

## 2012-02-16 DIAGNOSIS — J40 Bronchitis, not specified as acute or chronic: Secondary | ICD-10-CM

## 2012-02-16 MED ORDER — ALBUTEROL SULFATE HFA 108 (90 BASE) MCG/ACT IN AERS: 2.0000 | INHALATION_SPRAY | Freq: Four times a day (QID) | RESPIRATORY_TRACT | Status: DC | PRN

## 2012-02-16 NOTE — Patient Instructions (Signed)
It was good to see you today! I want you to try albuterol 2-3 times per day to see if it helps your cough. Finish your antibiotics and then come back to see [place].  We may need to make some changes to your medications.

## 2012-02-25 NOTE — Assessment & Plan Note (Addendum)
CXR does not show obvious consolidation/infiltrate.  Given patient's smoking history, recent PNA, and productive cough will continue antibiotics.  This will be for treatment of at least a COPD exacerbation.  Pt to follow up at end of treatment or if worsens.  If patient continues to have cough, might consider trial off of Lisinopril to see if any of cough is ACEI induced.

## 2012-02-25 NOTE — Progress Notes (Signed)
Patient ID: Jack Thornton, male   DOB: 12-17-1929, 78 y.o.   MRN: [id number] Subjective: The patient is a 78 y.o. year old male who presents today for f/u.  X-ray was obtained and did not show any evidence of PNA/consolidation.  Is still on Abx covering for possible HCAP.  Is otherwise doing well.  No fevers/chills, no n/v/d.  No dysuria.  Still has mildly productive cough.  No shortness of breath.  Continues to smoke.  Patient's past medical, social, and family history were reviewed and updated as appropriate. History  Substance Use Topics  . Smoking status: Current Every Day Smoker -- 1.0 packs/day for 50 years    Types: Cigarettes  . Smokeless tobacco: Never Used  . Alcohol Use: No   Objective:  Filed Vitals:   02/16/12 1511  BP: 128/64  Pulse: 77  Temp: 99.3 F (37.4 C)   Gen: NAD HEENT: MMM, EOMI, no pharyngeal erythema CV: RRR Resp: CTABL, globally decreased consistent with copd.  Assessment/Plan:  Please also see individual problems in problem list for problem-specific plans.

## 2012-02-26 ENCOUNTER — Ambulatory Visit (INDEPENDENT_AMBULATORY_CARE_PROVIDER_SITE_OTHER): Payer: Self-pay | Admitting: Family Medicine

## 2012-02-26 ENCOUNTER — Encounter: Payer: Self-pay | Admitting: Family Medicine

## 2012-02-26 VITALS — BP 130/78 | HR 70 | Temp 98.7°F | Wt 184.1 lb

## 2012-02-26 DIAGNOSIS — R05 Cough: Secondary | ICD-10-CM

## 2012-02-26 DIAGNOSIS — R059 Cough, unspecified: Secondary | ICD-10-CM

## 2012-02-26 MED ORDER — CLINDAMYCIN HCL 300 MG PO CAPS: 300.0000 mg | ORAL_CAPSULE | Freq: Three times a day (TID) | ORAL | Status: DC

## 2012-02-26 NOTE — Patient Instructions (Signed)
I want you to finish your current antibiotics and then start taking the new antibiotic I am prescribing for you. Please stop taking your lisinopril. Plan on coming back to see [place] in about 10 days so we can see how you are doing. You can take acetaminophen for headaches.

## 2012-02-26 NOTE — Progress Notes (Signed)
Patient ID: Jack Thornton, male   DOB: [birth year], 78 y.o.   MRN: [id number] Subjective: The patient is a 78 y.o. year old male who presents today for continued cough.  Cough continues to be productive.  No problems with breathing.  No fevers/chills.  Has had headache on three days in the morning.  No visual changes.  Reports that he still has 2 days of Abx left.  Pt does have occasional reflux for which he takes zantac.  Does not report any rhinorrhea or post nasal drip.    Patient's past medical, social, and family history were reviewed and updated as appropriate. History  Substance Use Topics  . Smoking status: Current Every Day Smoker -- 1.0 packs/day for 50 years    Types: Cigarettes  . Smokeless tobacco: Never Used  . Alcohol Use: No   Objective:  Filed Vitals:   02/26/12 0910  BP: 130/78  Pulse: 70  Temp: 98.7 F (37.1 C)   Gen: NAD HEENT: MMM, no evidence of post nasal drip CV: RRR Resp: Decreased air movement throughout.  Scattered rales, no focal findings.  No focal decreases in breath sounds.  Assessment/Plan:  Please also see individual problems in problem list for problem-specific plans.

## 2012-02-26 NOTE — Assessment & Plan Note (Signed)
Concerns are micro-aspiration vs post-obstructive.  CXR clear as of 10 days ago.  Will switch to clinda for better anaerobe coverage and see back in 10 days.  If still having symptoms would plan swallow eval and likely CT chest.  Will D/C lisinopril for now in case this has any exacerbating effect.

## 2012-03-07 ENCOUNTER — Ambulatory Visit (INDEPENDENT_AMBULATORY_CARE_PROVIDER_SITE_OTHER): Payer: Self-pay | Admitting: Family Medicine

## 2012-03-07 ENCOUNTER — Encounter: Payer: Self-pay | Admitting: Family Medicine

## 2012-03-07 VITALS — BP 148/78 | HR 71 | Temp 98.5°F | Ht 67.0 in | Wt 185.0 lb

## 2012-03-07 DIAGNOSIS — R05 Cough: Secondary | ICD-10-CM

## 2012-03-07 MED ORDER — LISINOPRIL 20 MG PO TABS: 20.0000 mg | ORAL_TABLET | Freq: Every day | ORAL | Status: DC

## 2012-03-07 NOTE — Assessment & Plan Note (Signed)
Continued productive cough that is relatively new for patient (6-7 weeks) Discussed options of continued observation or chest CT to rule out malignancy.  Patient elects for ct.  Will obtain this. As change in lisinopril has not been helpful will have patient restart this.

## 2012-03-07 NOTE — Patient Instructions (Signed)
It was good to see you today! We will get a CT scan of your chest and will be in touch about results. If the scan doesn't show anything concerning, then this cough is most likely related to long-term smoking. If the scan does show something, we will be talking about where to go from there.

## 2012-03-07 NOTE — Progress Notes (Signed)
Patient ID: Sire Poet, male   DOB: [birth year], 78 y.o.   MRN: [id number] Subjective: The patient is a 78 y.o. year old male who presents today for followup of his cough.  The patient is a mildly productive cough now for 6-7 weeks. He has been treated with 2 courses of antibiotics. First set of antibiotics was for possible healthcare associated pneumonia. Second was for possible aspiration. Is not noticed significant improvement. He was also instructed to hold his lisinopril in case this was causing his cough to be worse. It was any difference in this. He denies any fevers, chills, appetite changes, weight loss, or hemoptysis.  Patient's past medical, social, and family history were reviewed and updated as appropriate. History  Substance Use Topics  . Smoking status: Current Every Day Smoker -- 1.0 packs/day for 50 years    Types: Cigarettes  . Smokeless tobacco: Never Used  . Alcohol Use: No   Objective:  Filed Vitals:   03/07/12 1122  BP: 148/78  Pulse: 71  Temp: 98.5 F (36.9 C)   Gen: NAD CV: RRR Resp: Scattered wheezes bilaterally with increased expiratory time  Assessment/Plan:  Please also see individual problems in problem list for problem-specific plans.

## 2012-03-08 ENCOUNTER — Ambulatory Visit (HOSPITAL_COMMUNITY)
Admission: RE | Admit: 2012-03-08 | Discharge: 2012-03-08 | Disposition: A | Discharge status: Home / Self Care | Payer: Self-pay | Source: Ambulatory Visit | Attending: Family Medicine | Admitting: Family Medicine

## 2012-03-08 DIAGNOSIS — R05 Cough: Secondary | ICD-10-CM

## 2012-03-08 DIAGNOSIS — R059 Cough, unspecified: Secondary | ICD-10-CM | POA: Insufficient documentation

## 2012-03-12 ENCOUNTER — Encounter: Payer: Self-pay | Admitting: Family Medicine

## 2012-03-16 ENCOUNTER — Encounter: Payer: Self-pay | Admitting: Family Medicine

## 2012-05-13 ENCOUNTER — Encounter: Payer: Self-pay | Admitting: Family Medicine

## 2012-05-13 ENCOUNTER — Ambulatory Visit (INDEPENDENT_AMBULATORY_CARE_PROVIDER_SITE_OTHER): Payer: No Typology Code available for payment source | Admitting: Family Medicine

## 2012-05-13 VITALS — BP 168/80 | HR 87 | Temp 99.6°F | Ht 67.0 in | Wt 189.0 lb

## 2012-05-13 DIAGNOSIS — I10 Essential (primary) hypertension: Secondary | ICD-10-CM

## 2012-05-13 DIAGNOSIS — R319 Hematuria, unspecified: Secondary | ICD-10-CM

## 2012-05-13 DIAGNOSIS — Z23 Encounter for immunization: Secondary | ICD-10-CM

## 2012-05-13 LAB — POCT URINALYSIS DIPSTICK
Bilirubin, UA: NEGATIVE
Ketones, UA: NEGATIVE
pH, UA: 7

## 2012-05-13 LAB — POCT UA - MICROSCOPIC ONLY

## 2012-05-13 MED ORDER — TETANUS-DIPHTH-ACELL PERTUSSIS 5-2.5-18.5 LF-MCG/0.5 IM SUSP: 0.5000 mL | Freq: Once | INTRAMUSCULAR | Status: DC

## 2012-05-13 NOTE — Patient Instructions (Signed)
We are going to culture your urine.  We will let you know when to follow up with [facility].   Thanks, Dr. Durene Cal  Happy New Year!

## 2012-05-14 NOTE — Assessment & Plan Note (Addendum)
Patient with continued microscopic hematuria as well as white cells, bacteria. WIll culture urine as has completed treatment for UTI. Will not prescribe further antibiotics at this time. Possible hematuria from truama from self-catheterization (will continue at this time due to risk for stricture recurrence) . Will also have nursing contact [facility] urology on Thursday when office reopens to schedule follow up appointment.

## 2012-05-14 NOTE — Assessment & Plan Note (Signed)
Poorly controlled today due to patient not taking medication this morning. No red flags. Encouraged patient to take medication every day.

## 2012-05-14 NOTE — Progress Notes (Addendum)
Subjective:   1. ED follow up from [facility]-patient seen at [facility] 2 weeks ago for blood in urine described as large amounts noted in his stream for an entire day. He called [facility] urology who told him to go to ED at that time. He was seen and given ciprofloxacin for 2 days which was changed to 10 day course of nitrofurantoin 100mg  BID once sensitivities were available. He completed course of this 2 days ago> during this time he was told to self cath 4x per day up from normal 1-2 x. Patient states that after a few days of antibiotics he was no longer able to see blood. He complains today of some end stream burning whether he has the catheter in or not. No fever/chills/nausea/vomiting. He does not have plans to follow up with [facility] at this time as he was told only to follow up with PCP. Plan was for follow up with urology in 1 year.   Social hx-had planned to return to [place] but is staying In [place] due to some health issues in a family member.   2.  Hypertension- BP Readings from Last 3 Encounters:  05/13/12 168/80  03/07/12 148/78  02/26/12 130/78   Compliant with medications-yes typically and without side effects. DID not take BP meds this AM.  Denies any CP, HA, SOB, blurry vision, LE edema, transient weakness, orthopnea, PND.   ROS--See HPI  Past Medical History Patient Active Problem List  Diagnosis  . TOBACCO ABUSE  . Essential hypertension, benign  . BPH s/p simple prostatectomy with residual tissue. Followed by urology.   . Hematuria  . Urethral stricture requiring dilation and serial catheterization.   Marland Kitchen BPPV (benign paroxysmal positional vertigo)  . Productive cough    Reviewed problem list.  Medications- reviewed and updated Chief complaint-noted  Objective: BP 168/80  Pulse 87  Temp 99.6 F (37.6 C) (Oral)  Ht 5\' 7"  (1.702 m)  Wt 189 lb (85.73 kg)  BMI 29.60 kg/m2 Gen: NAD, resting comfortably  CV: RRR no mrg Lungs: CTAB Back: No CVA tenderness Ext: no edema  Urine  dipstick shows positive for RBC's, positive for protein and positive for leukocytes.  Micro exam: 10-20 WBC's per HPF, TNTC RBC's per HPF and 2+ bacteria.  Urine sent for culture.   Assessment/Plan: See problem oriented charted

## 2012-05-16 ENCOUNTER — Telehealth: Payer: Self-pay | Admitting: Family Medicine

## 2012-05-16 DIAGNOSIS — N390 Urinary tract infection, site not specified: Secondary | ICD-10-CM

## 2012-05-16 MED ORDER — CEPHALEXIN 500 MG PO CAPS: 500.0000 mg | ORAL_CAPSULE | Freq: Three times a day (TID) | ORAL | Status: DC

## 2012-05-16 NOTE — Telephone Encounter (Signed)
E coli on Urine culture. Informed patient's son of Rx that will be sent to [facility]. Will ask nursing staff to print rx and fax or phone in per verbal order.

## 2012-05-17 ENCOUNTER — Telehealth: Payer: Self-pay | Admitting: Family Medicine

## 2012-05-17 DIAGNOSIS — N390 Urinary tract infection, site not specified: Secondary | ICD-10-CM

## 2012-05-17 MED ORDER — CEFUROXIME AXETIL 500 MG PO TABS: 500.0000 mg | ORAL_TABLET | Freq: Two times a day (BID) | ORAL | Status: DC

## 2012-05-17 NOTE — Telephone Encounter (Signed)
Patient is returned call to Dr. Durene Cal.  I informed the patient of the message that was keyed in about the Ceftin.  He voiced understanding.

## 2012-05-17 NOTE — Addendum Note (Signed)
Addended by: Shelva Majestic on: 05/17/2012 02:57 PM   Modules accepted: Orders

## 2012-05-17 NOTE — Telephone Encounter (Signed)
MD called in Rx to [facility].  Attempted to call to followup.  No answer will try again @ 430 (per MD request). Samaa Ueda, [name]

## 2012-05-17 NOTE — Telephone Encounter (Signed)
Called in RX verbally. Jack Thornton, [name]

## 2012-05-17 NOTE — Telephone Encounter (Signed)
Informed Son of important message at clinic.   E. Coli is resistant to all tested antibiotics except for 3 that were tested. All of these are IV but one has a by mouth version. I have sent this Ceftin 100mg  BID PO #20 to the HD via message as it would not let me connect with a pharmacist. Nursing can call to give verbal order to confirm if not available when patient goes.   If patient develops any fever/chills/nausea/vomiting/back pain, he should go to the ED either here or at [facility] per patient preference as has seen urology there although this does appear to be UTI.

## 2012-05-17 NOTE — Telephone Encounter (Signed)
Spoke with [facility] pharmacist.  They have received the Rx and are working with the patient now to get the cheapest option for him.  States that he "will have it this afternoon."  Expressed my gratitude. Karsyn Jamie, [name]

## 2012-05-23 ENCOUNTER — Telehealth: Payer: Self-pay | Admitting: Family Medicine

## 2012-05-23 DIAGNOSIS — N390 Urinary tract infection, site not specified: Secondary | ICD-10-CM

## 2012-05-23 NOTE — Telephone Encounter (Signed)
Discussed with son need for follow up urine culture after treatment. Son states no fevers and symptoms of burning with urine better for father. Will follow up with lab (ordered future urine).

## 2012-06-13 ENCOUNTER — Other Ambulatory Visit: Payer: Self-pay | Admitting: Family Medicine

## 2012-06-13 MED ORDER — DOXAZOSIN MESYLATE 2 MG PO TABS: 2.0000 mg | ORAL_TABLET | Freq: Every day | ORAL | Status: DC

## 2012-08-03 ENCOUNTER — Inpatient Hospital Stay (HOSPITAL_COMMUNITY)
Admission: EM | Admit: 2012-08-03 | Discharge: 2012-08-07 | DRG: 690 | Disposition: A | Discharge status: Home Health | Payer: Medicaid Other | Attending: Family Medicine | Admitting: Family Medicine

## 2012-08-03 ENCOUNTER — Emergency Department (HOSPITAL_COMMUNITY): Payer: Medicaid Other

## 2012-08-03 ENCOUNTER — Encounter (HOSPITAL_COMMUNITY): Payer: Self-pay | Admitting: *Deleted

## 2012-08-03 ENCOUNTER — Emergency Department (INDEPENDENT_AMBULATORY_CARE_PROVIDER_SITE_OTHER)
Admission: EM | Admit: 2012-08-03 | Discharge: 2012-08-03 | Disposition: A | Discharge status: Home / Self Care | Payer: Medicaid Other | Source: Home / Self Care | Attending: Family Medicine | Admitting: Family Medicine

## 2012-08-03 ENCOUNTER — Encounter (HOSPITAL_COMMUNITY): Payer: Self-pay | Admitting: Emergency Medicine

## 2012-08-03 DIAGNOSIS — N119 Chronic tubulo-interstitial nephritis, unspecified: Principal | ICD-10-CM | POA: Diagnosis present

## 2012-08-03 DIAGNOSIS — N12 Tubulo-interstitial nephritis, not specified as acute or chronic: Secondary | ICD-10-CM

## 2012-08-03 DIAGNOSIS — N4289 Other specified disorders of prostate: Secondary | ICD-10-CM | POA: Diagnosis present

## 2012-08-03 DIAGNOSIS — H8110 Benign paroxysmal vertigo, unspecified ear: Secondary | ICD-10-CM | POA: Diagnosis present

## 2012-08-03 DIAGNOSIS — N189 Chronic kidney disease, unspecified: Secondary | ICD-10-CM | POA: Diagnosis present

## 2012-08-03 DIAGNOSIS — Z79899 Other long term (current) drug therapy: Secondary | ICD-10-CM

## 2012-08-03 DIAGNOSIS — R319 Hematuria, unspecified: Secondary | ICD-10-CM

## 2012-08-03 DIAGNOSIS — R599 Enlarged lymph nodes, unspecified: Secondary | ICD-10-CM | POA: Diagnosis present

## 2012-08-03 DIAGNOSIS — F17200 Nicotine dependence, unspecified, uncomplicated: Secondary | ICD-10-CM | POA: Diagnosis present

## 2012-08-03 DIAGNOSIS — I129 Hypertensive chronic kidney disease with stage 1 through stage 4 chronic kidney disease, or unspecified chronic kidney disease: Secondary | ICD-10-CM | POA: Diagnosis present

## 2012-08-03 DIAGNOSIS — C61 Malignant neoplasm of prostate: Secondary | ICD-10-CM

## 2012-08-03 DIAGNOSIS — N400 Enlarged prostate without lower urinary tract symptoms: Secondary | ICD-10-CM | POA: Diagnosis present

## 2012-08-03 DIAGNOSIS — A498 Other bacterial infections of unspecified site: Secondary | ICD-10-CM | POA: Diagnosis present

## 2012-08-03 DIAGNOSIS — R911 Solitary pulmonary nodule: Secondary | ICD-10-CM | POA: Diagnosis present

## 2012-08-03 DIAGNOSIS — N35919 Unspecified urethral stricture, male, unspecified site: Secondary | ICD-10-CM

## 2012-08-03 DIAGNOSIS — Z1612 Extended spectrum beta lactamase (ESBL) resistance: Secondary | ICD-10-CM

## 2012-08-03 DIAGNOSIS — N10 Acute tubulo-interstitial nephritis: Secondary | ICD-10-CM

## 2012-08-03 DIAGNOSIS — I10 Essential (primary) hypertension: Secondary | ICD-10-CM

## 2012-08-03 HISTORY — DX: Tubulo-interstitial nephritis, not specified as acute or chronic: N12

## 2012-08-03 LAB — POCT URINALYSIS DIP (DEVICE)
Bilirubin Urine: NEGATIVE
Glucose, UA: NEGATIVE mg/dL
Ketones, ur: NEGATIVE mg/dL
Nitrite: POSITIVE — AB

## 2012-08-03 LAB — POCT I-STAT, CHEM 8
Calcium, Ion: 1.19 mmol/L (ref 1.13–1.30)
Chloride: 103 mEq/L (ref 96–112)
Creatinine, Ser: 1.4 mg/dL — ABNORMAL HIGH (ref 0.50–1.35)
Glucose, Bld: 119 mg/dL — ABNORMAL HIGH (ref 70–99)
Hemoglobin: 15.6 g/dL (ref 13.0–17.0)
Potassium: 4 mEq/L (ref 3.5–5.1)

## 2012-08-03 LAB — CBC WITH DIFFERENTIAL/PLATELET
Basophils Relative: 0 % (ref 0–1)
Eosinophils Absolute: 0 10*3/uL (ref 0.0–0.7)
Eosinophils Relative: 0 % (ref 0–5)
HCT: 43.1 % (ref 39.0–52.0)
Hemoglobin: 15.5 g/dL (ref 13.0–17.0)
MCH: 33.3 pg (ref 26.0–34.0)
MCHC: 36 g/dL (ref 30.0–36.0)
MCV: 92.5 fL (ref 78.0–100.0)
Monocytes Absolute: 0.9 10*3/uL (ref 0.1–1.0)
Monocytes Relative: 10 % (ref 3–12)
Neutro Abs: 7.2 10*3/uL (ref 1.7–7.7)

## 2012-08-03 MED ORDER — SODIUM CHLORIDE 0.9 % IV SOLN
INTRAVENOUS | Status: DC
  Administered 2012-08-04 – 2012-08-05 (×4): via INTRAVENOUS
  Administered 2012-08-06: 500 mL via INTRAVENOUS
  Administered 2012-08-06 – 2012-08-07 (×2): via INTRAVENOUS

## 2012-08-03 MED ORDER — ONDANSETRON HCL 4 MG/2ML IJ SOLN: 4.0000 mg | Freq: Four times a day (QID) | INTRAMUSCULAR | Status: DC | PRN

## 2012-08-03 MED ORDER — SODIUM CHLORIDE 0.9 % IV BOLUS (SEPSIS)
1000.0000 mL | Freq: Once | INTRAVENOUS | Status: AC
  Administered 2012-08-03: 1000 mL via INTRAVENOUS

## 2012-08-03 MED ORDER — CEFTRIAXONE SODIUM 1 G IJ SOLR
1.0000 g | INTRAMUSCULAR | Status: DC
  Administered 2012-08-04 – 2012-08-05 (×2): 1 g via INTRAVENOUS
  Filled 2012-08-03 (×3): qty 10

## 2012-08-03 MED ORDER — POLYETHYLENE GLYCOL 3350 17 G PO PACK
17.0000 g | PACK | Freq: Every day | ORAL | Status: DC | PRN
  Administered 2012-08-04 – 2012-08-05 (×2): 17 g via ORAL
  Filled 2012-08-03 (×2): qty 1

## 2012-08-03 MED ORDER — DEXTROSE 5 % IV SOLN
1.0000 g | Freq: Once | INTRAVENOUS | Status: AC
  Administered 2012-08-03: 1 g via INTRAVENOUS
  Filled 2012-08-03: qty 10

## 2012-08-03 MED ORDER — ACETAMINOPHEN 650 MG RE SUPP: 650.0000 mg | Freq: Four times a day (QID) | RECTAL | Status: DC | PRN

## 2012-08-03 MED ORDER — LISINOPRIL 20 MG PO TABS
20.0000 mg | ORAL_TABLET | Freq: Every day | ORAL | Status: DC
  Administered 2012-08-04 – 2012-08-06 (×3): 20 mg via ORAL
  Filled 2012-08-03 (×4): qty 1

## 2012-08-03 MED ORDER — ALBUTEROL SULFATE HFA 108 (90 BASE) MCG/ACT IN AERS
2.0000 | INHALATION_SPRAY | Freq: Four times a day (QID) | RESPIRATORY_TRACT | Status: DC | PRN
  Filled 2012-08-03: qty 6.7

## 2012-08-03 MED ORDER — DOXAZOSIN MESYLATE 2 MG PO TABS
2.0000 mg | ORAL_TABLET | Freq: Every day | ORAL | Status: DC
  Administered 2012-08-04 – 2012-08-06 (×4): 2 mg via ORAL
  Filled 2012-08-03 (×5): qty 1

## 2012-08-03 MED ORDER — HYDROCODONE-ACETAMINOPHEN 5-325 MG PO TABS: 1.0000 | ORAL_TABLET | ORAL | Status: DC | PRN

## 2012-08-03 MED ORDER — ACETAMINOPHEN 325 MG PO TABS
ORAL_TABLET | ORAL | Status: AC
  Filled 2012-08-03: qty 3

## 2012-08-03 MED ORDER — HEPARIN SODIUM (PORCINE) 5000 UNIT/ML IJ SOLN
5000.0000 [IU] | Freq: Three times a day (TID) | INTRAMUSCULAR | Status: DC
  Administered 2012-08-04 – 2012-08-07 (×10): 5000 [IU] via SUBCUTANEOUS
  Filled 2012-08-03 (×13): qty 1

## 2012-08-03 MED ORDER — ACETAMINOPHEN 325 MG PO TABS: 650.0000 mg | ORAL_TABLET | Freq: Four times a day (QID) | ORAL | Status: DC | PRN

## 2012-08-03 MED ORDER — ONDANSETRON HCL 4 MG PO TABS: 4.0000 mg | ORAL_TABLET | Freq: Four times a day (QID) | ORAL | Status: DC | PRN

## 2012-08-03 NOTE — ED Notes (Signed)
Pt sent here from [facility] for R/O pyelonephritis.  C/o dysuria/burning sensation with urination, bil flank pain and fever x 2 days.  I-stat chem 8, and poct urine dipstick completed at [facility].  Tylenol given before leaving UC.

## 2012-08-03 NOTE — ED Notes (Signed)
Called [phone], secretary reports bed needs to be cleaned, then RN will call.

## 2012-08-03 NOTE — H&P (Signed)
Jack Thornton is an 79 y.o. male.   Chief Complaint: burning with urination and fatigue HPI:  History was obtained by talking with patient's son.  79 yo male with h/o HTN, prostatic hyperplasia, recurrent UTI's, ureteral strictures who presents with 2 day history of dysuria and polyuria worst today. This has been associated with fatigue, dizziness and chills. Also some left flank pain. He normally self-catherizes but in the last week, stopped doing it. He denies any abdominal pain, any nausea, vomiting. Has been eating and drinking normally.  He was admitted at [facility] for similar symptoms and discharged on 10 day course of nitrofurantoin for treatment of UTI. He finished course 2 weeks ago. Prior to this, he had been on cefuroxime.  Of note, patient's son denies any history of prostate cancer, only hyperplasia and is not sure what the prostatectomy done years ago in [place] was for.   In the ED, patient was found to have temperature of 102.8 and heart rate of 112. Vitals were otherwise normal. Urinalysis showed evidence of urinary tract infection. Patient was started on ceftriaxone. Additionally, CT abdomen showed prostatic mass with additional lymphadenopathy and right lower lung mass.  Family Medicine was consulted for admission for pyelonephritis.   Past Medical History  Diagnosis Date  . Prostate cancer   . Hypertension   . ACUT GASTR ULCER W/O MENTION HEMORR PERF/OBST 05/21/2009  . Essential hypertension, benign 05/21/2009  . HELICOBACTER PYLORI INFECTION 06/14/2009  . TOBACCO ABUSE 05/21/2009  . 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 Hematuria 08/04/2011    Patient has had hematuria on UA exam even after treatment for urinary tract infection. Does have history of instrumentation done at [facility] for hyperplastic prostate. If continues would consider sending to urology for further workup of the cystoscopy and    Past Surgical History  Procedure Laterality Date  . Prostate surgery  ~  2008  . Urethral dilation  ~ 2011  . Prostate surgery    . Bladder neck reconstruction    . Urethral dilation      Family History  Problem Relation Age of Onset  . Hypertension      mother and father  . Coronary artery disease      father  . Hypertension Mother   . Hypertension Father   . Heart disease Father    Social History:  reports that he has been smoking Cigarettes.  He has a 50 pack-year smoking history. He has never used smokeless tobacco. He reports that he does not drink alcohol or use illicit drugs.  Allergies: No Known Allergies   (Not in a hospital admission)  Results for orders placed during the hospital encounter of 08/03/12 (from the past 48 hour(s))  LACTIC ACID, PLASMA     Status: None   Collection Time    08/03/12  7:24 PM      Result Value Range   Lactic Acid, Venous 0.8  0.5 - 2.2 mmol/L   Ct Abdomen Pelvis Wo Contrast  08/03/2012  *RADIOLOGY REPORT*  Clinical Data: 79 year old male with left flank, abdominal and pelvic pain with painful urination. History of prostate cancer.  CT ABDOMEN AND PELVIS WITHOUT CONTRAST  Technique:  Multidetector CT imaging of the abdomen and pelvis was performed following the standard protocol without intravenous contrast.  Comparison: 12/01/2009 abdominal pelvic CT and 03/08/2012 chest CT  Findings: A 10 mm round pure ground-glass nodule within the posterior right lower lobe (image 3) is new since 2013.  The liver, spleen, pancreas, adrenal glands and gallbladder are unremarkable. Mild bilateral renal atrophy and left renal cyst identified.  There is no evidence of hydronephrosis or urinary calculi. Please note that parenchymal abnormalities may be missed as intravenous contrast was not administered.  There is new lymphadenopathy in the para-aortic and right pelvis worrisome for metastatic disease.  Index nodes include the following: A 1.4 x 1.9 cm left periaortic node (image 35) A 1.5 x 1.3 cm aortocaval node (image 38) A 1.2 x 1.3  cm right common iliac node (image 53).  No other enlarged lymph nodes are identified. There is no evidence of free fluid, biliary dilatation or abdominal aortic aneurysm.  Prostate enlargement is identified.  Increased soft tissue in the upper right prostate is suspicious for increasing neoplasm.  No acute or suspicious bony abnormalities are identified.  IMPRESSION: New adenopathy within the periaortic and right pelvis highly suspicious for metastatic disease (prostate).  Slightly increasing soft tissue in the upper right prostate region worrisome for increasing neoplasm.  10 mm right lower lobe ground-glass nodule - which may represent a primary neoplasm or metastatic disease.   Original Report Authenticated By: Harmon Pier, M.D.    Dg Chest 2 View  08/03/2012  *RADIOLOGY REPORT*  Clinical Data: Fever and short of breath  CHEST - 2 VIEW  Comparison: 02/14/2012  Findings: Lungs are clear without infiltrate or effusion.  Negative for heart failure or edema.  Negative for mass lesion.  IMPRESSION: No acute abnormality.   Original Report Authenticated By: Janeece Riggers, M.D.     Review of Systems  Constitutional: Positive for fever, chills and malaise/fatigue.  HENT: Negative for neck pain.   Eyes: Negative for blurred vision and double vision.  Respiratory: Negative for cough and shortness of breath.   Cardiovascular: Negative for chest pain, palpitations and leg swelling.  Gastrointestinal: Negative for nausea, vomiting, abdominal pain, diarrhea and constipation.  Genitourinary: Positive for dysuria, urgency, frequency and flank pain. Negative for hematuria.  Musculoskeletal: Positive for back pain. Negative for myalgias.       Lower back pain chronic  Skin: Negative for rash.  Neurological: Positive for dizziness and weakness. Negative for focal weakness, loss of consciousness and headaches.  Psychiatric/Behavioral: Negative for depression.   Blood pressure 118/61, pulse 88, temperature 98.9 F  (37.2 C), temperature source Oral, resp. rate 18, SpO2 97.00%. Physical Exam  Physical Examination: General appearance - alert, well appearing, and in no distress Neck - supple, no significant adenopathy Chest - coarse breath sounds bilaterally in lower lung fields, normal work of breathing.  Heart - normal rate, regular rhythm, normal S1, S2, no murmurs, rubs, clicks or gallops Abdomen - soft, non tender, mildly distended with palpable bladder dome, no CVA tenderness Extremities - peripheral pulses normal, no pedal edema, no clubbing or cyanosis Skin - normal coloration and turgor, no rashes, no suspicious skin lesions noted  Assessment/Plan 79 yo male with h/o HTN, prostatic hyperplasia, recurrent UTI's, ureteral strictures s/p dilation who presents with pyelonephritis.   # complicated pyelonephritis: appears to be chronic occurrence. likely from bladder obstruction. No evidence of hydronephrosis on CT. Hemodynamically stable with normal lactic acid and white count.  - continue ceftriaxone 1gm IV q24 - IV fluids - follow up urine and blood cultures - bladder scan for post-void residual. Will likely need catherization, but will hold for now.  - monitor vitals  # h/o prostatic hyperplasia with now prostatic mass with questionable metastatic disease on CT: will need to obtain  records from urologist at [facility] Dr. Kerby Moors to see what workup has been done.  - continue cardura for BPH  # HTN: stable  Continue home lisinopril 20mg  daily. Hold if hypotension.   # chronic kidney disease: creatinine baseline of 1.3-1.4. At baseline.  - follow up AM BMP  Fen/Gi: heart healthy diet with NS at 125cc/hr PPx: heparin 5000u tid Code status: full code Dispo: pending clinical improvement  Jack Thornton 08/03/2012, 9:27 PM

## 2012-08-03 NOTE — ED Provider Notes (Signed)
History     CSN: [id number]  Arrival date & time 08/03/12  1735   First MD Initiated Contact with Patient 08/03/12 1738      Chief Complaint  Patient presents with  . Urinary Tract Infection    (Consider location/radiation/quality/duration/timing/severity/associated sxs/prior treatment) The history is provided by the patient. A language interpreter was used.  Jack Thornton is a 79 y.o. male history of prostate Ca s/p prostatectomy, ureteral strictures, HTN here with possible pyelonephritis. He has recurrent UTIs and recently finished a course of Macrobid this month and Cipro 2 months ago. He's been having burning with urination for the last 2 days. Also has some left-sided flank pain as well. Also has fevers since yesterday. Went to urgent care and has a UA positive for UTI and fever 102 and sent here for rule out pyelonephritis.    Son was interpreting   Past Medical History  Diagnosis Date  . Prostate cancer   . Hypertension   . ACUT GASTR ULCER W/O MENTION HEMORR PERF/OBST 05/21/2009  . Essential hypertension, benign 05/21/2009  . HELICOBACTER PYLORI INFECTION 06/14/2009  . TOBACCO ABUSE 05/21/2009  . 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 Hematuria 08/04/2011    Patient has had hematuria on UA exam even after treatment for urinary tract infection. Does have history of instrumentation done at [facility] for hyperplastic prostate. If continues would consider sending to urology for further workup of the cystoscopy and    Past Surgical History  Procedure Laterality Date  . Prostate surgery  ~ 2008  . Urethral dilation  ~ 2011  . Prostate surgery    . Bladder neck reconstruction    . Urethral dilation      Family History  Problem Relation Age of Onset  . Hypertension      mother and father  . Coronary artery disease      father  . Hypertension Mother   . Hypertension Father   . Heart disease Father     History  Substance Use Topics  . Smoking status:  Current Every Day Smoker -- 1.00 packs/day for 50 years    Types: Cigarettes  . Smokeless tobacco: Never Used  . Alcohol Use: No      Review of Systems  Constitutional: Positive for fever.  Genitourinary: Positive for dysuria and flank pain.  All other systems reviewed and are negative.    Allergies  Review of patient's allergies indicates no known allergies.  Home Medications   Current Outpatient Rx  Name  Route  Sig  Dispense  Refill  . albuterol (PROVENTIL HFA;VENTOLIN HFA) 108 (90 BASE) MCG/ACT inhaler   Inhalation   Inhale 2 puffs into the lungs every 6 (six) hours as needed for wheezing.         Marland Kitchen doxazosin (CARDURA) 2 MG tablet   Oral   Take 2 mg by mouth at bedtime.         Marland Kitchen lisinopril (PRINIVIL,ZESTRIL) 20 MG tablet   Oral   Take 20 mg by mouth daily.           BP 118/61  Pulse 88  Temp(Src) 98.9 F (37.2 C) (Oral)  Resp 18  SpO2 97%  Physical Exam  Nursing note and vitals reviewed. Constitutional: He is oriented to person, place, and time. He appears well-developed and well-nourished.  HENT:  Head: Normocephalic.  Mouth/Throat: Oropharynx is clear and moist.  Eyes: Conjunctivae are normal. Pupils are equal, round, and reactive to  light.  Neck: Normal range of motion. Neck supple.  Cardiovascular: Regular rhythm and normal heart sounds.   Tachycardic   Pulmonary/Chest: Effort normal and breath sounds normal. No respiratory distress. He has no wheezes. He has no rales.  Abdominal: Bowel sounds are normal. He exhibits no distension. There is no tenderness. There is no rebound and no guarding.  No CVAT   Musculoskeletal: Normal range of motion. He exhibits no edema and no tenderness.  Neurological: He is alert and oriented to person, place, and time. No cranial nerve deficit. Coordination normal.  Skin: Skin is warm and dry.  Psychiatric: He has a normal mood and affect. His behavior is normal. Judgment and thought content normal.    ED  Course  Procedures (including critical care time)  Labs Reviewed  CULTURE, BLOOD (ROUTINE X 2)  CULTURE, BLOOD (ROUTINE X 2)  LACTIC ACID, PLASMA   Ct Abdomen Pelvis Wo Contrast  08/03/2012  *RADIOLOGY REPORT*  Clinical Data: 79 year old male with left flank, abdominal and pelvic pain with painful urination. History of prostate cancer.  CT ABDOMEN AND PELVIS WITHOUT CONTRAST  Technique:  Multidetector CT imaging of the abdomen and pelvis was performed following the standard protocol without intravenous contrast.  Comparison: 12/01/2009 abdominal pelvic CT and 03/08/2012 chest CT  Findings: A 10 mm round pure ground-glass nodule within the posterior right lower lobe (image 3) is new since 2013.  The liver, spleen, pancreas, adrenal glands and gallbladder are unremarkable. Mild bilateral renal atrophy and left renal cyst identified.  There is no evidence of hydronephrosis or urinary calculi. Please note that parenchymal abnormalities may be missed as intravenous contrast was not administered.  There is new lymphadenopathy in the para-aortic and right pelvis worrisome for metastatic disease.  Index nodes include the following: A 1.4 x 1.9 cm left periaortic node (image 35) A 1.5 x 1.3 cm aortocaval node (image 38) A 1.2 x 1.3 cm right common iliac node (image 53).  No other enlarged lymph nodes are identified. There is no evidence of free fluid, biliary dilatation or abdominal aortic aneurysm.  Prostate enlargement is identified.  Increased soft tissue in the upper right prostate is suspicious for increasing neoplasm.  No acute or suspicious bony abnormalities are identified.  IMPRESSION: New adenopathy within the periaortic and right pelvis highly suspicious for metastatic disease (prostate).  Slightly increasing soft tissue in the upper right prostate region worrisome for increasing neoplasm.  10 mm right lower lobe ground-glass nodule - which may represent a primary neoplasm or metastatic disease.    Original Report Authenticated By: Harmon Pier, M.D.    Dg Chest 2 View  08/03/2012  *RADIOLOGY REPORT*  Clinical Data: Fever and short of breath  CHEST - 2 VIEW  Comparison: 02/14/2012  Findings: Lungs are clear without infiltrate or effusion.  Negative for heart failure or edema.  Negative for mass lesion.  IMPRESSION: No acute abnormality.   Original Report Authenticated By: Janeece Riggers, M.D.      1. Pyelonephritis   2. Prostate cancer       MDM  Jack Thornton is a 78 y.o. male here with flank pain, fever, dysuria. Concerned for possible pyelonephritis, infected kidney stone, early urosepsis, vs ureteral stricture. He will need to get IV abx since he failed PO abx outpatient. Will do sepsis workup. Will get CT ab/pel to r/o strictures vs hydro. Will give IVF and ceftriaxone. Will likely need admission.   9:01 PM I called family medicine, who will admit for pyelo.  CT showed recurrence of prostate cancer. Family aware and medicine aware.            Richardean Canal, MD 08/03/12 2101

## 2012-08-03 NOTE — ED Provider Notes (Signed)
History     CSN: [id number]  Arrival date & time 08/03/12  1612   First MD Initiated Contact with Patient 08/03/12 1628      Chief Complaint  Patient presents with  . Dysuria    pain and burning with urinating. dizziness.     (Consider location/radiation/quality/duration/timing/severity/associated sxs/prior treatment) Patient is a 79 y.o. male presenting with dysuria. The history is provided by the patient and a relative.  Dysuria  This is a recurrent problem. The current episode started more than 2 days ago. The problem occurs every urination. The problem has been gradually worsening. The quality of the pain is described as burning. The pain is moderate. The maximum temperature recorded prior to his arrival was 102 to 102.9 F. Associated symptoms include chills, frequency and flank pain. Pertinent negatives include no sweats and no vomiting. His past medical history is significant for recurrent UTIs. Past medical history comments: similar hx in 10/2011.    Past Medical History  Diagnosis Date  . Prostate cancer   . Hypertension   . ACUT GASTR ULCER W/O MENTION HEMORR PERF/OBST 05/21/2009  . Essential hypertension, benign 05/21/2009  . HELICOBACTER PYLORI INFECTION 06/14/2009  . TOBACCO ABUSE 05/21/2009  . 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 Hematuria 08/04/2011    Patient has had hematuria on UA exam even after treatment for urinary tract infection. Does have history of instrumentation done at [facility] for hyperplastic prostate. If continues would consider sending to urology for further workup of the cystoscopy and    Past Surgical History  Procedure Laterality Date  . Prostate surgery  ~ 2008  . Urethral dilation  ~ 2011  . Prostate surgery    . Bladder neck reconstruction    . Urethral dilation      Family History  Problem Relation Age of Onset  . Hypertension      mother and father  . Coronary artery disease      father  . Hypertension Mother   .  Hypertension Father   . Heart disease Father     History  Substance Use Topics  . Smoking status: Current Every Day Smoker -- 1.00 packs/day for 50 years    Types: Cigarettes  . Smokeless tobacco: Never Used  . Alcohol Use: No      Review of Systems  Constitutional: Positive for fever and chills.  HENT: Negative.   Respiratory: Negative.   Cardiovascular: Negative.   Gastrointestinal: Negative.  Negative for vomiting.  Genitourinary: Positive for dysuria, frequency and flank pain.    Allergies  Review of patient's allergies indicates no known allergies.  Home Medications   Current Outpatient Rx  Name  Route  Sig  Dispense  Refill  . doxazosin (CARDURA) 2 MG tablet   Oral   Take 1 tablet (2 mg total) by mouth at bedtime.   30 tablet   11   . lisinopril (PRINIVIL,ZESTRIL) 20 MG tablet   Oral   Take 1 tablet (20 mg total) by mouth daily.   90 tablet   3   . albuterol (PROVENTIL HFA;VENTOLIN HFA) 108 (90 BASE) MCG/ACT inhaler   Inhalation   Inhale 2 puffs into the lungs every 6 (six) hours as needed for wheezing.   1 Inhaler   0   . cefUROXime (CEFTIN) 500 MG tablet   Oral   Take 1 tablet (500 mg total) by mouth 2 (two) times daily.   20 tablet   0  BP 112/67  Pulse 112  Temp(Src) 102.8 F (39.3 C) (Oral)  Resp 20  SpO2 97%  Physical Exam  Nursing note and vitals reviewed. Constitutional: He is oriented to person, place, and time. He appears well-developed and well-nourished. No distress.  Neck: Normal range of motion. Neck supple.  Cardiovascular: Regular rhythm and normal heart sounds.   Pulmonary/Chest: Breath sounds normal.  Abdominal: Soft. Bowel sounds are normal. He exhibits no distension and no mass. There is no tenderness. There is CVA tenderness. There is no rigidity, no rebound and no guarding.  Lymphadenopathy:    He has no cervical adenopathy.  Neurological: He is alert and oriented to person, place, and time.  Skin: Skin is warm  and dry.    ED Course  Procedures (including critical care time)  Labs Reviewed  POCT URINALYSIS DIP (DEVICE) - Abnormal; Notable for the following:    Hgb urine dipstick LARGE (*)    Protein, ur >=300 (*)    Nitrite POSITIVE (*)    Leukocytes, UA MODERATE (*)    All other components within normal limits  POCT I-STAT, CHEM 8 - Abnormal; Notable for the following:    Creatinine, Ser 1.40 (*)    Glucose, Bld 119 (*)    All other components within normal limits  URINE CULTURE  CBC WITH DIFFERENTIAL   No results found.   1. Pyelonephritis, acute       MDM  Sent for eval of probable pyelonephritis in 79 yo male with urologic hx .        Linna Hoff, MD 08/03/12 1723

## 2012-08-03 NOTE — ED Notes (Signed)
Pt given tylenol 925 mg for fever at 5:25 p.m

## 2012-08-03 NOTE — ED Notes (Signed)
Pt c/o dysuria/ burning sensation with urinating and fever x 2 days.  Pt is also having dizziness and fatigue x 2 days.  Pt has not had any meds for pain or fever.

## 2012-08-04 DIAGNOSIS — N400 Enlarged prostate without lower urinary tract symptoms: Secondary | ICD-10-CM

## 2012-08-04 DIAGNOSIS — I10 Essential (primary) hypertension: Secondary | ICD-10-CM

## 2012-08-04 LAB — CBC
HCT: 38.8 % — ABNORMAL LOW (ref 39.0–52.0)
HCT: 39.4 % (ref 39.0–52.0)
Hemoglobin: 14 g/dL (ref 13.0–17.0)
Hemoglobin: 13.8 g/dL (ref 13.0–17.0)
MCH: 32.5 pg (ref 26.0–34.0)
MCH: 32.5 pg (ref 26.0–34.0)
MCHC: 36.1 g/dL — ABNORMAL HIGH (ref 30.0–36.0)
MCHC: 35 g/dL (ref 30.0–36.0)
MCV: 90 fL (ref 78.0–100.0)
MCV: 92.9 fL (ref 78.0–100.0)
RDW: 13.3 % (ref 11.5–15.5)

## 2012-08-04 LAB — BASIC METABOLIC PANEL
BUN: 20 mg/dL (ref 6–23)
CO2: 24 mEq/L (ref 19–32)
Chloride: 102 mEq/L (ref 96–112)
GFR calc Af Amer: 56 mL/min — ABNORMAL LOW (ref >90)
Glucose, Bld: 106 mg/dL — ABNORMAL HIGH (ref 70–99)
Potassium: 3.7 mEq/L (ref 3.5–5.1)

## 2012-08-04 NOTE — H&P (Signed)
FMTS Attending Admission Note: Tanishia Lemaster MD [phone] pager office [phone] I  have seen and examined this patient, reviewed their chart. I have discussed this patient with the resident. I agree with the resident's findings, assessment and care plan. 

## 2012-08-04 NOTE — Progress Notes (Signed)
Daily Progress Note Family Medicine Teaching Service Amber M. Hairford, MD Service Pager: [phone]   Subjective: Pt sleeping, son at bedside. No complaints at this time.  Objective: Vital signs in last 24 hours: Temp:  [98.6 F (37 C)-102.8 F (39.3 C)] 98.6 F (37 C) (03/22 2345) Pulse Rate:  [78-112] 82 (03/22 2345) Resp:  [18-20] 20 (03/22 2345) BP: (106-130)/(54-74) 119/67 mmHg (03/22 2345) SpO2:  [93 %-97 %] 95 % (03/22 2345) Weight:  [181 lb 14.1 oz (82.5 kg)] 181 lb 14.1 oz (82.5 kg) (03/22 2345) Weight change:    Intake/Output from previous day: None recorded due to incontinence    Intake/Output this shift: None recorded   Physical Exam: Gen: Sleeping, awakes easily.  Uses son as interpreter HEENT: AT, [place]. MMM. Chronic changes of right eye. Poor dentition. Abd: Soft, nontender Ext: Moves all extremities.  Neuro: Grossly intact, although he does not speak english it is hard to assess mental status  Lab Results:  Recent Labs  08/04/12 0011 08/04/12 0545  WBC 8.3 8.4  HGB 14.0 13.8  HCT 38.8* 39.4  PLT 136* 140*   BMET  Recent Labs  08/03/12 1719 08/04/12 0011 08/04/12 0545  NA 136  --  135  K 4.0  --  3.7  CL 103  --  102  CO2  --   --  24  GLUCOSE 119*  --  106*  BUN 22  --  20  CREATININE 1.40* 1.31 1.34  CALCIUM  --   --  8.7    Studies/Results: Ct Abdomen Pelvis Wo Contrast  08/03/2012  *RADIOLOGY REPORT*  Clinical Data: 79 year old male with left flank, abdominal and pelvic pain with painful urination. History of prostate cancer.  CT ABDOMEN AND PELVIS WITHOUT CONTRAST  Technique:  Multidetector CT imaging of the abdomen and pelvis was performed following the standard protocol without intravenous contrast.  Comparison: 12/01/2009 abdominal pelvic CT and 03/08/2012 chest CT  Findings: A 10 mm round pure ground-glass nodule within the posterior right lower lobe (image 3) is new since 2013.  The liver, spleen, pancreas, adrenal glands and  gallbladder are unremarkable. Mild bilateral renal atrophy and left renal cyst identified.  There is no evidence of hydronephrosis or urinary calculi. Please note that parenchymal abnormalities may be missed as intravenous contrast was not administered.  There is new lymphadenopathy in the para-aortic and right pelvis worrisome for metastatic disease.  Index nodes include the following: A 1.4 x 1.9 cm left periaortic node (image 35) A 1.5 x 1.3 cm aortocaval node (image 38) A 1.2 x 1.3 cm right common iliac node (image 53).  No other enlarged lymph nodes are identified. There is no evidence of free fluid, biliary dilatation or abdominal aortic aneurysm.  Prostate enlargement is identified.  Increased soft tissue in the upper right prostate is suspicious for increasing neoplasm.  No acute or suspicious bony abnormalities are identified.  IMPRESSION: New adenopathy within the periaortic and right pelvis highly suspicious for metastatic disease (prostate).  Slightly increasing soft tissue in the upper right prostate region worrisome for increasing neoplasm.  10 mm right lower lobe ground-glass nodule - which may represent a primary neoplasm or metastatic disease.   Original Report Authenticated By: Harmon Pier, M.D.    Dg Chest 2 View  08/03/2012  *RADIOLOGY REPORT*  Clinical Data: Fever and short of breath  CHEST - 2 VIEW  Comparison: 02/14/2012  Findings: Lungs are clear without infiltrate or effusion.  Negative for heart failure or edema.  Negative for mass lesion.  IMPRESSION: No acute abnormality.   Original Report Authenticated By: Janeece Riggers, M.D.     Medications: I have reviewed the patient's current medications.  Assessment/Plan: 79 yo male with h/o HTN, prostatic hyperplasia, recurrent UTI's, ureteral strictures s/p dilation who presents with pyelonephritis.   # Complicated pyelonephritis: appears to be chronic occurrence likely from bladder obstruction with a possible acute infection since he has  not been doing self cath at home. No evidence of hydronephrosis on CT. Hemodynamically stable with normal lactic acid and white count.  - Continue ceftriaxone 1gm IV q24 for at least 24 hours (2 doses) and then plan transition to PO based on clinical assessment.  - IV fluids, encourage PO intake and decrease IVF as tolerated (no diet restrictions) - Urine and blood cultures pending - No urine output recorded, but likely due to his incontinence. PVR was 58cc. Continue to monitor but will insert foley if no urine output.  # History of prostatic hyperplasia with now prostatic mass with questionable metastatic disease on CT - continue cardura for BPH  - Will contact [facility] for old records today, if possible. Will have pt sign release of information and fax to office. If no response today since it is a weekend, will try again first thing in the morning.  - No inpatient biopsy planned at this time  # HTN: stable  - Continue home lisinopril 20mg  daily.   # CKD: creatinine baseline of 1.3-1.4. At baseline.  - Repeat AM BMet  Fen/Gi: Heart healthy diet with NS at 125cc/hr  PPx: Heparin SQ Code status: Full code  Dispo: Pending clinical improvement and further work up.   LOS: 1 day   HAIRFORD, AMBER 08/04/2012, 8:57 AM

## 2012-08-04 NOTE — Progress Notes (Signed)
Bladder scan performed - no residual recorded.

## 2012-08-04 NOTE — Progress Notes (Signed)
FMTS Attending Daily Note: Jack Levy MD [phone] pager office [phone] I  have seen and examined this patient, reviewed their chart. I have discussed this patient with the resident. I agree with the resident's findings, assessment and care plan.Our team has discussed new finding of enlarged area on prostate and pulmonary nodule and family has been told it potentially could be malignancy. Per his son, his previous prostatectomy was NOT for malignancy---we have requested notes from [facility].

## 2012-08-04 NOTE — Progress Notes (Signed)
Bladder scan post residual void results were 58cc.

## 2012-08-05 LAB — CBC
HCT: 36.5 % — ABNORMAL LOW (ref 39.0–52.0)
MCHC: 34.8 g/dL (ref 30.0–36.0)
MCV: 93.8 fL (ref 78.0–100.0)
Platelets: 127 10*3/uL — ABNORMAL LOW (ref 150–400)
RDW: 13.3 % (ref 11.5–15.5)
WBC: 3.8 10*3/uL — ABNORMAL LOW (ref 4.0–10.5)

## 2012-08-05 LAB — BASIC METABOLIC PANEL
BUN: 17 mg/dL (ref 6–23)
Calcium: 8.2 mg/dL — ABNORMAL LOW (ref 8.4–10.5)
Chloride: 107 mEq/L (ref 96–112)
Creatinine, Ser: 1.43 mg/dL — ABNORMAL HIGH (ref 0.50–1.35)
GFR calc Af Amer: 51 mL/min — ABNORMAL LOW (ref >90)
GFR calc non Af Amer: 44 mL/min — ABNORMAL LOW (ref >90)

## 2012-08-05 NOTE — Progress Notes (Signed)
FMTS Attending Admission Note: Jack Sturgess,MD I  have seen and examined this patient, reviewed their chart. I have discussed this patient with the resident. I agree with the resident's findings, assessment and care plan.  Plan to monitor his kidney function since he is prone to obstructive uropathy.

## 2012-08-05 NOTE — Progress Notes (Signed)
Offered to in and out cath pt, per MD order. Pt communicated through pt's son, that he was not feeling pressure or the need to be self cathed; he refused I & O cath at this time. Explained that pt has order for I & O cath to be done BID per MD. Pt son and pt verbalized understanding and that when pt was ready for I & O cath, to let nurse know so that it could be done. [place], Rosanna Randy

## 2012-08-05 NOTE — Progress Notes (Signed)
Utilization review completed.  

## 2012-08-05 NOTE — Progress Notes (Signed)
Daily Progress Note Family Medicine Teaching Service Service Pager: [phone]   Subjective: Doing ok this morning. Spoke to patient through his son. Has some pain with urination. Son states patient had been cathing until about one week ago. Has history of UTI's that are resistant to multiple drugs and has been previously switched on antibiotics after initial treatment once CX results returned.  Objective: Vital signs in last 24 hours: Temp:  [98.1 F (36.7 C)-99.7 F (37.6 C)] 98.1 F (36.7 C) (03/24 0537) Pulse Rate:  [78-89] 89 (03/24 0537) Resp:  [18-20] 18 (03/24 0537) BP: (106-152)/(52-65) 152/58 mmHg (03/24 0537) SpO2:  [94 %-100 %] 94 % (03/24 0537) Weight change:    Intake/Output from previous day: None recorded due to incontinence  03/23 0701 - 03/24 0700 In: 2020.8 [I.V.:1970.8; IV Piggyback:50] Out: 853 [Urine:852; Stool:1] Intake/Output this shift: None recordedTotal I/O In: -  Out: 225 [Urine:225]  Physical Exam: Gen: NAD, resting comfortably in bed HEENT: AT, [place]. MMM. Chronic changes of right eye. Poor dentition. Abd: Soft, nontender Ext: Moves all extremities. No edema. Neuro: Grossly intact, although he does not speak english it is hard to assess mental status  Lab Results:  Recent Labs  08/04/12 0545 08/05/12 0550  WBC 8.4 3.8*  HGB 13.8 12.7*  HCT 39.4 36.5*  PLT 140* 127*   BMET  Recent Labs  08/04/12 0545 08/05/12 0550  NA 135 138  K 3.7 4.1  CL 102 107  CO2 24 21  GLUCOSE 106* 78  BUN 20 17  CREATININE 1.34 1.43*  CALCIUM 8.7 8.2*    Studies/Results: Ct Abdomen Pelvis Wo Contrast  08/03/2012  IMPRESSION: New adenopathy within the periaortic and right pelvis highly suspicious for metastatic disease (prostate).  Slightly increasing soft tissue in the upper right prostate region worrisome for increasing neoplasm.  10 mm right lower lobe ground-glass nodule - which may represent a primary neoplasm or metastatic disease.   Original Report  Authenticated By: Harmon Pier, M.D.    Dg Chest 2 View  08/03/2012  IMPRESSION: No acute abnormality.   Original Report Authenticated By: Janeece Riggers, M.D.   Assessment/Plan: 79 yo male with h/o HTN, prostatic hyperplasia, recurrent UTI's, ureteral strictures s/p dilation who presents with pyelonephritis.   # Complicated pyelonephritis: appears to be chronic occurrence likely from bladder obstruction with a possible acute infection since he has not been doing self cath at home. No evidence of hydronephrosis on CT. Hemodynamically stable with normal lactic acid and white count.  - Continue ceftriaxone 1gm IV q24 until Cx results return then transition to PO antibiotics - IV fluids, encourage PO intake and decrease IVF as tolerated (no diet restrictions) - Urine and blood cultures pending - No urine output recorded, but likely due to his incontinence. PVR was 0 with last check. Continue to monitor but will insert foley if no urine output.  # History of prostatic hyperplasia with now prostatic mass with questionable metastatic disease on CT - continue cardura for BPH  - will need outpatient f/u with urology at [facility] for workup of prostate mass.  - No inpatient biopsy planned at this time  # HTN: stable  - Continue home lisinopril 20mg  daily.   # CKD: creatinine baseline of 1.3-1.4. At baseline. Though with increase today to 1.43 from 1.31 on admission - Repeat AM BMet  Fen/Gi: Heart healthy diet with NS at 125cc/hr  PPx: Heparin SQ Code status: Full code  Dispo: Pending clinical improvement and further work up.  LOS: 2 days   Marikay Alar 08/05/2012, 9:36 AM

## 2012-08-06 LAB — BASIC METABOLIC PANEL
CO2: 24 mEq/L (ref 19–32)
Calcium: 8.9 mg/dL (ref 8.4–10.5)
Creatinine, Ser: 1.19 mg/dL (ref 0.50–1.35)
GFR calc Af Amer: 64 mL/min — ABNORMAL LOW (ref >90)
GFR calc non Af Amer: 55 mL/min — ABNORMAL LOW (ref >90)
Sodium: 139 mEq/L (ref 135–145)

## 2012-08-06 LAB — URINE CULTURE

## 2012-08-06 MED ORDER — SODIUM CHLORIDE 0.9 % IV SOLN
1.0000 g | INTRAVENOUS | Status: DC
  Administered 2012-08-06 – 2012-08-07 (×2): 1 g via INTRAVENOUS
  Filled 2012-08-06 (×2): qty 1

## 2012-08-06 NOTE — Progress Notes (Signed)
Daily Progress Note Family Medicine Teaching Service Service Pager: [phone]   Subjective: Doing ok this morning. Spoke to patient through his son. Continues to have urgency/frequency with urination but no fever or chills.    Objective: Vital signs in last 24 hours: Temp:  [97.9 F (36.6 C)-98.9 F (37.2 C)] 97.9 F (36.6 C) (03/25 0602) Pulse Rate:  [72-79] 76 (03/25 0602) Resp:  [18-20] 18 (03/25 0602) BP: (131-175)/(60-87) 164/80 mmHg (03/25 0602) SpO2:  [96 %-99 %] 96 % (03/25 0602) Weight:  [182 lb 1.6 oz (82.6 kg)] 182 lb 1.6 oz (82.6 kg) (03/24 2122) Weight change:    Intake/Output from previous day: None recorded due to incontinence  03/24 0701 - 03/25 0700 In: 240 [P.O.:240] Out: 2175 [Urine:2175] Intake/Output this shift: None recordedTotal I/O In: -  Out: 150 [Urine:150]  Physical Exam: Gen: NAD, resting comfortably in bed HEENT: AT, [place]. MMM. Chronic changes of right eye. Poor dentition. Abd: Soft, nontender Ext: Moves all extremities. No edema. Neuro: Grossly intact, although he does not speak english it is hard to assess mental status  Lab Results:  Recent Labs  08/04/12 0545 08/05/12 0550  WBC 8.4 3.8*  HGB 13.8 12.7*  HCT 39.4 36.5*  PLT 140* 127*   BMET  Recent Labs  08/04/12 0545 08/05/12 0550  NA 135 138  K 3.7 4.1  CL 102 107  CO2 24 21  GLUCOSE 106* 78  BUN 20 17  CREATININE 1.34 1.43*  CALCIUM 8.7 8.2*    Studies/Results: Ct Abdomen Pelvis Wo Contrast  08/03/2012  IMPRESSION: New adenopathy within the periaortic and right pelvis highly suspicious for metastatic disease (prostate).  Slightly increasing soft tissue in the upper right prostate region worrisome for increasing neoplasm.  10 mm right lower lobe ground-glass nodule - which may represent a primary neoplasm or metastatic disease.   Original Report Authenticated By: Harmon Pier, M.D.    Dg Chest 2 View  08/03/2012  IMPRESSION: No acute abnormality.   Original Report  Authenticated By: Janeece Riggers, M.D.   Assessment/Plan: 79 yo male with h/o HTN, prostatic hyperplasia, recurrent UTI's, ureteral strictures s/p dilation who presents with pyelonephritis.   # Complicated pyelonephritis: appears to be chronic occurrence likely from bladder obstruction with a possible acute infection since he has not been doing self cath at home. No evidence of hydronephrosis on CT. Hemodynamically stable with normal lactic acid and white count.  - Continue ceftriaxone 1gm IV q24 until Cx results return then transition to PO antibiotics - IV fluids, encourage PO intake and decrease IVF as tolerated (no diet restrictions) - UCx sensitives pending, BCx NGTD final  - UOP 2.1 L yesterday.  Continue to monitor    # History of prostatic hyperplasia with now prostatic mass with questionable metastatic disease on CT - continue cardura for BPH  - will need outpatient f/u with urology at [facility] for workup of prostate mass.  Talked extensively with son today about the best continuum of care for him would be to go back to his urologist there and then be direct to oncologist/pulmonogist from there.  - No inpatient biopsy planned at this time  # HTN: stable  - Continue home lisinopril 20mg  daily.   # CKD: creatinine baseline of 1.3-1.4. At baseline.  - Repeat AM BMet  Fen/Gi: Heart healthy diet with NS at 125cc/hr  PPx: Heparin SQ Code status: Full code  Dispo: Pending clinical improvement and further work up.    LOS: 3 days   Charice Zuno,  Braedyn Kauk 08/06/2012, 8:54 AM

## 2012-08-06 NOTE — Progress Notes (Signed)
PGY-2 Progress note:  Urine culture returned ESBL E.coli sensitive to imipenem and pip/tazo. He has not been adequately treated in the hospital while on ceftriaxone but has remained afebrile and objectively improved. Spoke with Marcelino Duster, PharmD with ID and suggested starting on Ertapenem which is is q24 hour dosing. Will give first dose now and discuss home health options with family, including PICC line and outpatient medications. This may be complicated by his insurance status, but will get case management involved at this point.  Donta Mcinroy M. Malachai Schalk, M.D. 08/06/2012 1:53 PM

## 2012-08-06 NOTE — Progress Notes (Signed)
FMTS Attending Admission Note: Zakee Deerman,MD I  have seen and examined this patient, reviewed their chart. I have discussed this patient with the resident. I agree with the resident's findings, assessment and care plan.  

## 2012-08-07 DIAGNOSIS — B9689 Other specified bacterial agents as the cause of diseases classified elsewhere: Secondary | ICD-10-CM

## 2012-08-07 DIAGNOSIS — Z1619 Resistance to other specified beta lactam antibiotics: Secondary | ICD-10-CM

## 2012-08-07 LAB — BASIC METABOLIC PANEL
Calcium: 8.5 mg/dL (ref 8.4–10.5)
GFR calc Af Amer: 61 mL/min — ABNORMAL LOW (ref >90)
GFR calc non Af Amer: 52 mL/min — ABNORMAL LOW (ref >90)
Glucose, Bld: 96 mg/dL (ref 70–99)
Potassium: 4.1 mEq/L (ref 3.5–5.1)
Sodium: 136 mEq/L (ref 135–145)

## 2012-08-07 MED ORDER — SODIUM CHLORIDE 0.9 % IV SOLN: 1.0000 g | INTRAVENOUS | Status: DC

## 2012-08-07 MED ORDER — SODIUM CHLORIDE 0.9 % IJ SOLN: 10.0000 mL | INTRAMUSCULAR | Status: DC | PRN

## 2012-08-07 NOTE — Progress Notes (Signed)
Daily Progress Note Family Medicine Teaching Service Service Pager: [phone]   Subjective: Doing well this morning. Still with pain on urination.    Objective: Vital signs in last 24 hours: Temp:  [98 F (36.7 C)-99.5 F (37.5 C)] 98 F (36.7 C) (03/26 0602) Pulse Rate:  [69-73] 73 (03/26 0602) Resp:  [18-20] 20 (03/26 0602) BP: (139-164)/(51-87) 139/61 mmHg (03/26 0602) SpO2:  [96 %-99 %] 96 % (03/26 0602) Weight:  [192 lb 11.2 oz (87.408 kg)] 192 lb 11.2 oz (87.408 kg) (03/25 2102) Weight change: 10 lb 9.6 oz (4.808 kg) Last BM Date: 08/04/12 Intake/Output from previous day: None recorded due to incontinence  03/25 0701 - 03/26 0700 In: 4997.1 [P.O.:120; I.V.:4877.1] Out: 1725 [Urine:1725] Intake/Output this shift: None recorded   Physical Exam: Gen: NAD, resting comfortably in bed HEENT: AT, [place]. Chronic changes of right eye. Poor dentition. CV: rrr, no mrg Pulm: CTAB Ext: Moves all extremities. No edema. Neuro: Grossly intact, although he does not speak english it is hard to assess mental status  Lab Results:  Recent Labs  08/05/12 0550  WBC 3.8*  HGB 12.7*  HCT 36.5*  PLT 127*   BMET  Recent Labs  08/06/12 0923 08/07/12 0605  NA 139 136  K 3.9 4.1  CL 104 105  CO2 24 23  GLUCOSE 135* 96  BUN 12 12  CREATININE 1.19 1.25  CALCIUM 8.9 8.5    Studies/Results: Ct Abdomen Pelvis Wo Contrast  08/03/2012  IMPRESSION: New adenopathy within the periaortic and right pelvis highly suspicious for metastatic disease (prostate).  Slightly increasing soft tissue in the upper right prostate region worrisome for increasing neoplasm.  10 mm right lower lobe ground-glass nodule - which may represent a primary neoplasm or metastatic disease.   Original Report Authenticated By: Harmon Pier, M.D.    Dg Chest 2 View  08/03/2012  IMPRESSION: No acute abnormality.   Original Report Authenticated By: Janeece Riggers, M.D.   Assessment/Plan: 79 yo male with h/o HTN, prostatic  hyperplasia, recurrent UTI's, ureteral strictures s/p dilation who presents with pyelonephritis.   # Complicated pyelonephritis: appears to be chronic occurrence likely from bladder obstruction with a possible acute infection since he has not been doing self cath at home. No evidence of hydronephrosis on CT. Hemodynamically stable with normal lactic acid and white count.  - UCx grew ESBL E coli-after discussion with pharmacy patient is on ertapenem 1 g daily-will need 7 day course - Discontinued ceftriaxone 1gm IV q24 given UCx results - IV fluids, encourage PO intake and decrease IVF as tolerated (no diet restrictions) - BCx NGTD final  - UOP 1.7 L yesterday.  Continue to monitor - CM has set up [facility] for antibiotics at home - PICC line to be placed today   # History of prostatic hyperplasia with now prostatic mass with questionable metastatic disease on CT - continue cardura for BPH  - will need [facility] for workup of prostate mass.  Talked extensively with son today about the best continuum of care for him would be to go back to his urologist there and then be direct to oncologist/pulmonogist from there.  - No inpatient biopsy planned at this time  # HTN: stable  - Continue home lisinopril 20mg  daily.   # CKD: creatinine baseline of 1.3-1.4. At baseline.  - continue to follow  Fen/Gi: Heart healthy diet, KVO PPx: Heparin SQ Code status: Full code  Dispo: possible discharge today if PICC can be  placed    LOS: 4 days   Marikay Alar 08/07/2012, 7:18 AM

## 2012-08-07 NOTE — Progress Notes (Signed)
Attempted to place foley cath without success, and attempted coude cath - all without success.  Patient still voiding clear yellow urine regardless of foley cathter attempts.  MD aware of foley catheter attempts and patient is still ok to discharge with the need to go to the urologist in the morning.  Discussed discharge instructions with patient's son, including picc line care and medication administration times for today and tomorrow.  Son stated understanding of all instructions and he plans on taking patient to urologist in the am tomorrow.

## 2012-08-07 NOTE — Progress Notes (Signed)
Peripherally Inserted Central Catheter/Midline Placement  The IV Nurse has discussed with the patient and/or persons authorized to consent for the patient, the purpose of this procedure and the potential benefits and risks involved with this procedure.  The benefits include less needle sticks, lab draws from the catheter and patient may be discharged home with the catheter.  Risks include, but not limited to, infection, bleeding, blood clot (thrombus formation), and puncture of an artery; nerve damage and irregular heat beat.  Alternatives to this procedure were also discussed.  PICC/Midline Placement Documentation        Jack Thornton 08/07/2012, 12:23 PM

## 2012-08-07 NOTE — Discharge Summary (Signed)
Physician Discharge Summary  Patient ID: Jack Thornton MRN: [id number] DOB: Mar 12, 1930 Age: 79 y.o.  Admit date: 08/03/2012 Discharge date: 08/07/2012 Admitting Physician: Nestor Ramp, MD  PCP: [name], MD  Consultants: none     Discharge Diagnosis: Principal Problem:   Pyelonephritis Active Problems:   TOBACCO ABUSE   Essential hypertension, benign   BPH s/p simple prostatectomy with residual tissue. Followed by urology.    Urethral stricture requiring dilation and serial catheterization.    BPPV (benign paroxysmal positional vertigo)    Hospital Course 79 yo male with h/o HTN, prostatic hyperplasia, recurrent UTI's, urethral strictures s/p dilation who presents with pyelonephritis.   # Complicated pyelonephritis: patient presented to ED with 2 day history of dysuria and polyuria with fatigue, dizziness, and chills accompanied by left flank pain. UA obtained in the ED revealed positive nitrites and moderate LE.  No evidence of hydronephrosis on CT. Hemodynamically stable with normal lactic acid and white count. Patient was empirically started on ceftriaxone until UCx grew ESBL E coli and after discussion with pharmacy patient is on ertapenem 1 g daily. Patient was maintained on IVF and encouraged PO intake. Monitored UOP and this was steady throughout hospitalization. PICC line was placed prior to discharge for patient to receive IV antibiotics at home. Additionally BCx revealed no growth final.  # History of prostatic hyperplasia: CT abd/pelvis revealed soft tissue mass worrisome for neoplasm, new adenopathy in periaortic region and right pelvis, and right lower lobe lung nodule worrisome for neoplasm. Discussion was had regarding these new findings and was determined that patient would benefit from outpatient work-up by his urologist and other specialists at [facility]. Notably patient is supposed to be I/O cathing at home and had not been doing this for the week prior to admission.  Patient refused I/O cath during hospitalization and attempt at placing foley and coude cath failed as patient continued to leak urine around catheter. Patient was advised to present to [facility] urology clinic day after discharge for evaluation given CT findings and inability to place catheter.   # HTN: stable. Continued home lisinopril 20mg  daily.   # CKD: creatinine baseline of 1.3-1.4. At baseline during hospitalization. Maintained with IVF.  Problem List 1. Complicated pyelonephritis.  2. History BPH 3. Prostatic mass 4. Abdominal adenopathy 5. Lung nodule 6. HTN 7. CKD        Discharge PE   Filed Vitals:   08/07/12 1400  BP: 139/61  Pulse: 73  Temp: 98.2 F (36.8 C)  Resp: 20   Gen: NAD, resting comfortably in bed HEENT: AT, [place]. Chronic changes of right eye. Poor dentition.  CV: rrr, no mrg  Pulm: CTAB Ext: Moves all extremities. No edema.  Neuro: Grossly intact, although he does not speak english it is hard to assess mental status   Procedures/Imaging:  Ct Abdomen Pelvis Wo Contrast  08/03/2012   IMPRESSION: New adenopathy within the periaortic and right pelvis highly suspicious for metastatic disease (prostate).  Slightly increasing soft tissue in the upper right prostate region worrisome for increasing neoplasm.  10 mm right lower lobe ground-glass nodule - which may represent a primary neoplasm or metastatic disease.   Original Report Authenticated By: Harmon Pier, M.D.    Dg Chest 2 View  08/03/2012  IMPRESSION: No acute abnormality.   Original Report Authenticated By: Janeece Riggers, M.D.     Labs  CBC  Recent Labs Lab 08/04/12 0011 08/04/12 0545 08/05/12 0550  WBC 8.3 8.4 3.8*  HGB 14.0 13.8  12.7*  HCT 38.8* 39.4 36.5*  PLT 136* 140* 127*   BMET  Recent Labs Lab 08/05/12 0550 08/06/12 0923 08/07/12 0605  NA 138 139 136  K 4.1 3.9 4.1  CL 107 104 105  CO2 21 24 23   BUN 17 12 12   CREATININE 1.43* 1.19 1.25  CALCIUM 8.2* 8.9 8.5  GLUCOSE 78 135* 96    Results for orders placed during the hospital encounter of 08/03/12 (from the past 72 hour(s))  CBC     Status: Abnormal   Collection Time    08/05/12  5:50 AM      Result Value Range   WBC 3.8 (*) 4.0 - 10.5 K/uL   RBC 3.89 (*) 4.22 - 5.81 MIL/uL   Hemoglobin 12.7 (*) 13.0 - 17.0 g/dL   HCT 86.5 (*) 78.4 - 69.6 %   MCV 93.8  78.0 - 100.0 fL   MCH 32.6  26.0 - 34.0 pg   MCHC 34.8  30.0 - 36.0 g/dL   RDW 29.5  28.4 - 13.2 %   Platelets 127 (*) 150 - 400 K/uL  BASIC METABOLIC PANEL     Status: Abnormal   Collection Time    08/05/12  5:50 AM      Result Value Range   Sodium 138  135 - 145 mEq/L   Potassium 4.1  3.5 - 5.1 mEq/L   Chloride 107  96 - 112 mEq/L   CO2 21  19 - 32 mEq/L   Glucose, Bld 78  70 - 99 mg/dL   BUN 17  6 - 23 mg/dL   Creatinine, Ser 4.40 (*) 0.50 - 1.35 mg/dL   Calcium 8.2 (*) 8.4 - 10.5 mg/dL   GFR calc non Af Amer 44 (*) >90 mL/min   GFR calc Af Amer 51 (*) >90 mL/min   Comment:            The eGFR has been calculated     using the CKD EPI equation.     This calculation has not been     validated in all clinical     situations.     eGFR's persistently     <90 mL/min signify     possible Chronic Kidney Disease.  BASIC METABOLIC PANEL     Status: Abnormal   Collection Time    08/06/12  9:23 AM      Result Value Range   Sodium 139  135 - 145 mEq/L   Potassium 3.9  3.5 - 5.1 mEq/L   Chloride 104  96 - 112 mEq/L   CO2 24  19 - 32 mEq/L   Glucose, Bld 135 (*) 70 - 99 mg/dL   BUN 12  6 - 23 mg/dL   Creatinine, Ser 1.02  0.50 - 1.35 mg/dL   Calcium 8.9  8.4 - 72.5 mg/dL   GFR calc non Af Amer 55 (*) >90 mL/min   GFR calc Af Amer 64 (*) >90 mL/min   Comment:            The eGFR has been calculated     using the CKD EPI equation.     This calculation has not been     validated in all clinical     situations.     eGFR's persistently     <90 mL/min signify     possible Chronic Kidney Disease.  BASIC METABOLIC PANEL     Status: Abnormal    Collection Time    08/07/12  6:05 AM      Result Value Range   Sodium 136  135 - 145 mEq/L   Potassium 4.1  3.5 - 5.1 mEq/L   Chloride 105  96 - 112 mEq/L   CO2 23  19 - 32 mEq/L   Glucose, Bld 96  70 - 99 mg/dL   BUN 12  6 - 23 mg/dL   Creatinine, Ser 1.61  0.50 - 1.35 mg/dL   Calcium 8.5  8.4 - 09.6 mg/dL   GFR calc non Af Amer 52 (*) >90 mL/min   GFR calc Af Amer 61 (*) >90 mL/min   Comment:            The eGFR has been calculated     using the CKD EPI equation.     This calculation has not been     validated in all clinical     situations.     eGFR's persistently     <90 mL/min signify     possible Chronic Kidney Disease.       Patient condition at time of discharge/disposition: stable  Disposition-home   Follow up issues: 1. Patient will need outpatient urology evaluation-advised to present to [facility] urology clinc day after discharge to be seen since son was unable to make an appointment for the patient prior to discharge. Ensure patient follows through with this. 2. Completion of full course of antibiotics for ESBL E coli pyelo 3. Work on compliance regarding home catheterization   Discharge follow up:  Follow-up Information   Follow up with [name], MD On 08/14/2012. (2:30 pm)    Contact information:   [address] [place] [address] [phone]          Discharge Orders   Future Appointments Provider Department Dept Phone   08/14/2012 2:30 PM Shelva Majestic, MD [facility] [phone]   Future Orders Complete By Expires     Increase activity slowly  As directed         Discharge Instructions: Please refer to Patient Instructions section of EMR for full details.  Patient was counseled important signs and symptoms that should prompt return to medical care, changes in medications, dietary instructions, activity restrictions, and follow up appointments.  Discharge Medications   Medication List    TAKE these medications        albuterol 108 (90 BASE) MCG/ACT inhaler  Commonly known as:  PROVENTIL HFA;VENTOLIN HFA  Inhale 2 puffs into the lungs every 6 (six) hours as needed for wheezing.     doxazosin 2 MG tablet  Commonly known as:  CARDURA  Take 2 mg by mouth at bedtime.     lisinopril 20 MG tablet  Commonly known as:  PRINIVIL,ZESTRIL  Take 20 mg by mouth daily.     sodium chloride 0.9 % SOLN 50 mL with ertapenem 1 G SOLR 1 g  Inject 1 g into the vein daily.       Marikay Alar, MD of [facility] 08/07/2012 9:31 PM

## 2012-08-07 NOTE — Progress Notes (Signed)
FMTS Attending Admission Note: Jack Wetherell,MD I  have seen and examined this patient, reviewed their chart. I have discussed this patient with the resident. I agree with the resident's findings, assessment and care plan.  

## 2012-08-07 NOTE — Care Management Note (Signed)
   CARE MANAGEMENT NOTE 08/07/2012  Patient:  Jack Thornton, Jack Thornton   Account Number:  [id number]  Date Initiated:  08/05/2012  Documentation initiated by:  Darlyne Russian  Subjective/Objective Assessment:   Patient admitted with UTI, fever.  PMH: prostate CA, recurrent UTI, self cath.     Action/Plan:   Progression of care and discharge planning  Order for assistance with IV antibiotics and HHRN.   Anticipated DC Date:  08/08/2012   Anticipated DC Plan:  HOME W HOME HEALTH SERVICES      DC Planning Services  Medication Assistance      Choice offered to / List presented to:          [facility] arranged  HH-1 RN      [facility] agency  [facility]   Status of service:  Completed, signed off Medicare Important Message given?   (If response is "NO", the following Medicare IM given date fields will be blank) Date Medicare IM given:   Date Additional Medicare IM given:    Discharge Disposition:  HOME W HOME HEALTH SERVICES  Per UR Regulation:  Reviewed for med. necessity/level of care/duration of stay  If discussed at Long Length of Stay Meetings, dates discussed:    Comments:   08/07/2012 PICC placed this am, pt for d/c this pm, [facility] will provide HHRN and IV antibiotics, pt ready for d/c. Johny Shock RN MPH Case Manager  08/06/2012 Discussed need for IV antibiotics with Dr Adriana Simas, also discussed with [facility] rep, Lupita Leash who provided cost of this medication to the patient. [facility] will talk with pt son to determine if there will be a cost to pt or if pt would qualify for charity care. This case manager discussed with pt son and currently await [facility] to talk with son. Johny Shock RN MPH Case Manager [phone]

## 2012-08-08 NOTE — Discharge Summary (Signed)
FMTS Attending Admission Note: Jack Alatorre,MD I  have seen and examined this patient, reviewed their chart. I have discussed this patient with the resident. I agree with the resident's findings, assessment and care plan.  In addition Dr Elna Breslow would plan to set patient up with oncologist and pulmonologist if he does not get to see his oncologist soon enough for referral.

## 2012-08-09 ENCOUNTER — Telehealth: Payer: Self-pay | Admitting: Family Medicine

## 2012-08-09 NOTE — Telephone Encounter (Signed)
Patient would like you to call him.  He needs to discuss something with you.

## 2012-08-10 MED ORDER — TRAMADOL HCL 50 MG PO TABS: 50.0000 mg | ORAL_TABLET | Freq: Three times a day (TID) | ORAL | Status: DC | PRN

## 2012-08-10 NOTE — ED Notes (Signed)
Urine culture: >100,000 colonies E.Coli ESBL.  Pt. was transferred to the ED and treated with IV Rocephin. 3/27 Lab shown to Dr. Artis Flock and he said no further action for [place] here.  Pt. was admitted. Cherly Anderson M\08/10/2012

## 2012-08-10 NOTE — Telephone Encounter (Signed)
Spoke with son. Ever since he had catheter placed on [facility] with urology, he has had discomfort primarily with peeing. No blood noted. Still taking IV antibiotics. No fevers.  He has a follow up appointment on Friday. He has taken scheduled tylenol with mild relief.   I have rxed tramadol for him and told him he can pick this up at [facility] 24 hours. If pain not improved, asked him to call early Monday morning like 8:30 or 9 and hopefully I can get a stronger script for him by the time he comes by the office to work on the orange card at 11. If pain worsens, patient will need to go to ED unfortunately. Also patient has option to go to [facility] even in ED where his urologist is.

## 2012-08-11 LAB — CULTURE, BLOOD (ROUTINE X 2)

## 2012-08-12 ENCOUNTER — Encounter: Payer: Self-pay | Admitting: Family Medicine

## 2012-08-12 ENCOUNTER — Ambulatory Visit (INDEPENDENT_AMBULATORY_CARE_PROVIDER_SITE_OTHER): Payer: Medicaid Other | Admitting: Family Medicine

## 2012-08-12 VITALS — BP 146/79 | HR 86 | Temp 98.1°F | Ht 65.0 in | Wt 187.6 lb

## 2012-08-12 DIAGNOSIS — R3989 Other symptoms and signs involving the genitourinary system: Secondary | ICD-10-CM

## 2012-08-12 DIAGNOSIS — N35919 Unspecified urethral stricture, male, unspecified site: Secondary | ICD-10-CM

## 2012-08-12 MED ORDER — PHENAZOPYRIDINE HCL 200 MG PO TABS: 200.0000 mg | ORAL_TABLET | Freq: Three times a day (TID) | ORAL | Status: DC | PRN

## 2012-08-12 MED ORDER — BACITRACIN 500 UNIT/GM EX OINT: 1.0000 "application " | TOPICAL_OINTMENT | Freq: Two times a day (BID) | CUTANEOUS | Status: DC

## 2012-08-12 NOTE — Assessment & Plan Note (Signed)
Remains on ertapenem for ESBL pyelonephritis.  Suspect urethral irritation from indwelling foley and recent cystoscopy. Also with some irritation around urethral meatus.  May continue tramadol.  Will also add pyridium and bacitracin to use around urethral meatus.  Instructed to keep follow up appointment with urology.

## 2012-08-12 NOTE — Patient Instructions (Addendum)
Thank you for coming in today, it was good to see you I have sent in a medication called pyridium that may make it more comfortable for you to urinate.  It may make your urine look orange.  If you notice a lot of blood however you should be seen Apply the antibiotic ointment to the urethral opening twice daily Keep your follow up appointment with your urologist.  Return for fevers, chills, worsening pain.

## 2012-08-12 NOTE — Progress Notes (Signed)
  Subjective:    Patient ID: Jack Thornton, male    DOB: [birth year], 79 y.o.   MRN: [id number]  HPI  Patient with history of BPH s/p prostatectomy recently hospitalized for pyelonephritis with ESBL.  He remains on ertapenem for tx of pyelo.  Is supposed to be self catheterizing daily but has not recently due to difficulty passing catheter. C/o of urethral pain since foley was placed during hospitalization.  Reports that he only has pain when urine drains from foley.  He denies fever, flank pain, hematuria, leaking around foley. During hospitalization prostate mass was discovered and was seen by his urologist at [facility] last week and had cystoscopy performed.  Pt. Reports findings on cystoscopy were normal and he has follow up with him on 4/4 for further workup of mass.  Tramadol rx'ed by Dr. Durene Cal has not been helpful for pain  Review of Systems Per HPI    Objective:   Physical Exam  Constitutional: He appears well-nourished. No distress.  HENT:  Head: Normocephalic and atraumatic.  Cardiovascular: Normal rate and regular rhythm.   Pulmonary/Chest: Effort normal and breath sounds normal.  Abdominal: Soft. Bowel sounds are normal. He exhibits no distension. There is no tenderness.  No CVA tenderness   Genitourinary:  Penis with some tenderness.  Some erythema surrounding urethral meatus. No purulent drainage.  Testicles non tender, no swelling.           Assessment & Plan:

## 2012-08-14 ENCOUNTER — Inpatient Hospital Stay: Payer: Self-pay | Admitting: Family Medicine

## 2012-08-23 ENCOUNTER — Inpatient Hospital Stay: Payer: Self-pay | Admitting: Family Medicine

## 2012-08-27 ENCOUNTER — Encounter: Payer: Self-pay | Admitting: Family Medicine

## 2012-08-27 ENCOUNTER — Ambulatory Visit (INDEPENDENT_AMBULATORY_CARE_PROVIDER_SITE_OTHER): Payer: Medicaid Other | Admitting: Family Medicine

## 2012-08-27 VITALS — BP 139/78 | HR 83 | Wt 183.0 lb

## 2012-08-27 DIAGNOSIS — R42 Dizziness and giddiness: Secondary | ICD-10-CM

## 2012-08-27 DIAGNOSIS — N12 Tubulo-interstitial nephritis, not specified as acute or chronic: Secondary | ICD-10-CM

## 2012-08-27 NOTE — Progress Notes (Signed)
Subjective:     Patient ID: Jack Thornton, male   DOB: 10-22-1929, 79 y.o.   MRN: [id number]  HPI Jack Thornton presents today for follow up.  He has a history of BPH s/p prostatectomy and was recently hospitalized for pyelonephritis with ESBL.  Patient is accompanied by son who translates. Patient is currently on last day of IV Ertapenem (son reports that [facility] extended treatment for an additional 2 weeks after completion of 5 days course following Discharge from hospital).  Patient is doing well today.  He has been compliant with self-catherization.  He denies any fevers, chills.  Some pain reported with catherization.  Patient also endorses some dizziness when going from lying to standing in the am.    Review of Systems See HPI    Objective:   Physical Exam Filed Vitals:   08/27/12 0857  BP: 139/78  Pulse: 83   General: well appearing elderly gentlemen in NAD. Heart: RRR. No murmurs, rubs, or gallops. Lungs: CTAB. MSK: No CVA tenderness.    Assessment:         Plan:

## 2012-08-27 NOTE — Assessment & Plan Note (Signed)
Patient will complete treatment today - Received >2 weeks of Ertapenem for ESBL.

## 2012-08-27 NOTE — Assessment & Plan Note (Signed)
Likely secondary to orthostasis from Doxazosin.  Advise patient to sit up on side of bed for several minutes before standing.   Will not discontinue medication as Urology is managing patient.  Advise patient and son to discuss this with [facility] upon follow up.

## 2012-08-27 NOTE — Patient Instructions (Addendum)
[name] is doing well.  Be sure that he finishes his antibiotics and follows up with [facility] urology.  Also, be sure that he continues to self-catherize daily as indicated by urology.  He can follow up with Dr. Durene Cal in 3 - 6 months.

## 2012-09-02 ENCOUNTER — Encounter: Payer: Self-pay | Admitting: Family Medicine

## 2012-09-13 DIAGNOSIS — C689 Malignant neoplasm of urinary organ, unspecified: Secondary | ICD-10-CM | POA: Insufficient documentation

## 2012-09-18 LAB — HEPATIC FUNCTION PANEL
ALT: 28 U/L (ref 10–40)
AST: 20 U/L (ref 14–40)
Alkaline Phosphatase: 73 U/L (ref 25–125)

## 2012-09-18 LAB — CBC AND DIFFERENTIAL
HCT: 43 % (ref 41–53)
WBC: 5.1 10^3/mL

## 2012-09-18 LAB — BASIC METABOLIC PANEL: BUN: 15 mg/dL (ref 4–21)

## 2012-09-19 ENCOUNTER — Ambulatory Visit (INDEPENDENT_AMBULATORY_CARE_PROVIDER_SITE_OTHER): Payer: No Typology Code available for payment source | Admitting: Family Medicine

## 2012-09-19 VITALS — BP 130/75 | HR 65 | Temp 98.8°F | Ht 65.0 in | Wt 184.0 lb

## 2012-09-19 DIAGNOSIS — C679 Malignant neoplasm of bladder, unspecified: Secondary | ICD-10-CM | POA: Insufficient documentation

## 2012-09-19 DIAGNOSIS — C61 Malignant neoplasm of prostate: Secondary | ICD-10-CM

## 2012-09-19 DIAGNOSIS — R1310 Dysphagia, unspecified: Secondary | ICD-10-CM

## 2012-09-19 DIAGNOSIS — N400 Enlarged prostate without lower urinary tract symptoms: Secondary | ICD-10-CM

## 2012-09-19 NOTE — Patient Instructions (Signed)
It was nice to meet you- I think the swallowing problem is from the anesthesia you had when you had the biopsy.  It should go away by itself.    If you have chest pain, pain with deep breaths, fast heart rate, or trouble breathing please seek medical care.

## 2012-09-19 NOTE — Assessment & Plan Note (Addendum)
Unusual history- but exam normal and vitals normal.  Suspect this is from ET tube from general anesthesia from biopsy, and reassured the patient it would resolve on it's own.  Also reviewed red flags for which to seek medical attention. No symptoms to suggest PE

## 2012-09-19 NOTE — Progress Notes (Signed)
  Subjective:    Patient ID: Jack Thornton, male    DOB: [birth year], 79 y.o.   MRN: [id number]  HPI:  [name] comes in with his son who is translating today.  He says he had a biopsy of his prostate done last Friday, May 3rd.  He has been taking oxycodone for pain.  He says that two days ago he started having problems with swallowing food and liquid- he says when he swallows it feels like the food goes off to the left side instead of straight down, then wraps around to his back.  He denies dyspnea, chest pain, pain with inspiration, palpitations, LE edema or pain.   His son reports that [facility] did call with the biopsy results and it is prostate cancer.  He has an appointment scheduled to see oncology.  He continues to have a indwelling catheter.   Past Medical History  Diagnosis Date  . Prostate cancer   . Hypertension   . ACUT GASTR ULCER W/O MENTION HEMORR PERF/OBST 05/21/2009  . Essential hypertension, benign 05/21/2009  . HELICOBACTER PYLORI INFECTION 06/14/2009  . TOBACCO ABUSE 05/21/2009  . 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 Hematuria 08/04/2011    Patient has had hematuria on UA exam even after treatment for urinary tract infection. Does have history of instrumentation done at [facility] for hyperplastic prostate. If continues would consider sending to urology for further workup of the cystoscopy and    History  Substance Use Topics  . Smoking status: Current Every Day Smoker -- 1.00 packs/day for 50 years    Types: Cigarettes  . Smokeless tobacco: Never Used  . Alcohol Use: No    Family History  Problem Relation Age of Onset  . Hypertension      mother and father  . Coronary artery disease      father  . Hypertension Mother   . Hypertension Father   . Heart disease Father      ROS Pertinent items in HPI    Objective:  Physical Exam:  BP 130/75  Pulse 65  Temp(Src) 98.8 F (37.1 C) (Oral)  Ht 5\' 5"  (1.651 m)  Wt 184 lb (83.462 kg)  BMI 30.62  kg/m2 General appearance: alert, cooperative and no distress Head: Normocephalic, without obvious abnormality, atraumatic Neck: No adenopathy, no pain to palpation, normal range of motion, normal size thyroid  Lungs: clear to auscultation bilaterally voice is normal Heart: regular rate and rhythm, S1, S2 normal, no murmur, click, rub or gallop Pulses: 2+ and symmetric Extremities: No edema, no calf tenderness       Assessment & Plan:

## 2012-09-19 NOTE — Assessment & Plan Note (Signed)
Biopsy done at [facility], we do not have records yet, he is already scheduled to see Oncology.  Will continue to follow.

## 2012-09-19 NOTE — Assessment & Plan Note (Signed)
His son says that he was called with biopsy results indicating prostate cancer which he shared with his father.

## 2012-10-29 ENCOUNTER — Encounter: Payer: Self-pay | Admitting: Family Medicine

## 2012-11-27 ENCOUNTER — Other Ambulatory Visit: Payer: Self-pay | Admitting: Family Medicine

## 2012-11-27 DIAGNOSIS — I10 Essential (primary) hypertension: Secondary | ICD-10-CM

## 2012-11-27 MED ORDER — LISINOPRIL 20 MG PO TABS: 20.0000 mg | ORAL_TABLET | Freq: Every day | ORAL | Status: AC

## 2012-11-29 ENCOUNTER — Ambulatory Visit (INDEPENDENT_AMBULATORY_CARE_PROVIDER_SITE_OTHER): Payer: No Typology Code available for payment source | Admitting: Family Medicine

## 2012-11-29 VITALS — BP 145/74 | HR 92 | Wt 187.0 lb

## 2012-11-29 DIAGNOSIS — R040 Epistaxis: Secondary | ICD-10-CM | POA: Insufficient documentation

## 2012-11-29 MED ORDER — OXYMETAZOLINE HCL 0.05 % NA SOLN: 2.0000 | Freq: Two times a day (BID) | NASAL | Status: DC

## 2012-11-29 NOTE — Patient Instructions (Addendum)
Nosebleed Nosebleeds can be caused by many conditions including trauma, infections, polyps, foreign bodies, dry mucous membranes or climate, medications and air conditioning. Most nosebleeds occur in the front of the nose. It is because of this location that most nosebleeds can be controlled by pinching the nostrils gently and continuously. Do this for at least 10 to 20 minutes. The reason for this long continuous pressure is that you must hold it long enough for the blood to clot. If during that 10 to 20 minute time period, pressure is released, the process may have to be started again. The nosebleed may stop by itself, quit with pressure, need concentrated heating (cautery) or stop with pressure from packing. SEEK IMMEDIATE MEDICAL CARE IF:   Bleeding recurs and cannot be controlled.  There is unusual bleeding from or bruising on other parts of the body.  You have a fever.  Nosebleeds continue.  There is any worsening of the condition which originally brought you in.  You become lightheaded, feel faint, become sweaty or vomit blood. You can use the nasal solution prescribed if the above does not stop the bleeding. If no relieve I recommended to ger evaluated right away.

## 2012-11-29 NOTE — Assessment & Plan Note (Signed)
Concerning  for thrombocytopenia since pt is on chemotherapy.  This was discussed with Pt and son (who is present  in the room) pt declines blood testing here since he is going on Monday to see his Oncologist. No signs of hemodynamic instability.  P/ Instruction on how to manage nosebleed in the future and surveillance for other bleeding sources.  F/u as needed.

## 2012-11-29 NOTE — Progress Notes (Signed)
Family Medicine Office Visit Note   Subjective:   Patient ID: Jack Thornton, male  DOB: Oct 13, 1929, 79 y.o.. MRN: [id number]   Pt that comes today for same day appointment complaining of nosebleed this afternoon. He comes accompanied by his son. The nosebleed was from his right nostril, with a duration of  3-5 min and self resolved applying pressure. The amount was reported to be mild without clots. Pt is receiving chemotherapy at [facility] for Prostate cancer. His most recent Chemo was last Monday (#3) and he is going again on Monday 21st for his 4th session. Pt denies any headaches, nausea, vomiting, lightheadedness  or other symptoms prior, during or after the episode of nosebleed. Denies tarry stools or other sources of bleeding.  Review of Systems:  Per HPI  Objective:   Physical Exam: Gen:  NAD HEENT: Moist mucous membranes. Right eye cataracts. Nose: right nostril with some dried blood present. No active bleeding. No masses seen. CV: Regular rate and rhythm, no murmurs rubs or gallops PULM: Clear to auscultation bilaterally. No wheezes/rales/rhonchi EXT: No edema Neuro: Alert and oriented x3. No focalization  Assessment & Plan:

## 2013-01-21 ENCOUNTER — Telehealth: Payer: Self-pay | Admitting: *Deleted

## 2013-01-21 DIAGNOSIS — C61 Malignant neoplasm of prostate: Secondary | ICD-10-CM

## 2013-01-21 NOTE — Addendum Note (Signed)
Addended by: Shelva Majestic on: 01/21/2013 05:09 PM   Modules accepted: Orders

## 2013-01-21 NOTE — Telephone Encounter (Signed)
That is fine if he gets his labs here. Which labs do I need to order for him? I have entered CBC with differential. Any other labs needed?

## 2013-01-21 NOTE — Telephone Encounter (Signed)
Melissa nurse from [facility] cancer unit calls asking if ok to have patient cbc/lab repeated here since Dr Durene Cal is primary,to avoid a long ride for the patient to [facility] cancer unit.Melissa states she'll fax over  an updated office visit note from Dr Selena Batten his oncologist and an order for repeat cbc since platelet count was 45.Patient will call to schedule lab appointment.This will be sent to Dr hunter to update him of patient care Jack Thornton, Jack Thornton

## 2013-01-22 ENCOUNTER — Other Ambulatory Visit: Payer: Medicaid Other

## 2013-01-22 NOTE — Telephone Encounter (Signed)
Dr Devonne Doughty already had repeat cbc done at [facility] lab this [facility] was informed.copy of notes and order where place in you box.thank you. Sherre Wooton, Virgel Bouquet

## 2013-01-30 ENCOUNTER — Ambulatory Visit: Payer: Medicaid Other

## 2013-03-24 IMAGING — CT CT CHEST W/O CM
4 series · 18 of 30 positions shown, 19 images · non-contrast
Comparison: None.

CLINICAL DATA: Persistent productive cough

CT CHEST WITHOUT CONTRAST
TECHNIQUE: Multidetector CT imaging of the chest was performed
following the standard protocol without IV contrast.

[Series 2: routine chest · axial · 0.83mm/px · z∈[-198,-108]mm · 3 of 56 slices shown, 4 images]
[im 19/56  mediastinal]
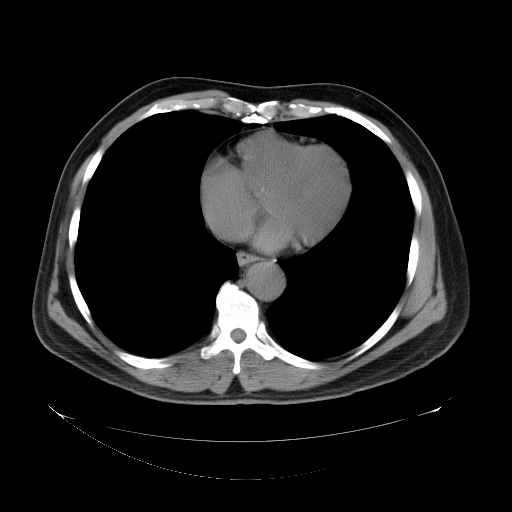
[im 19/56  lung]
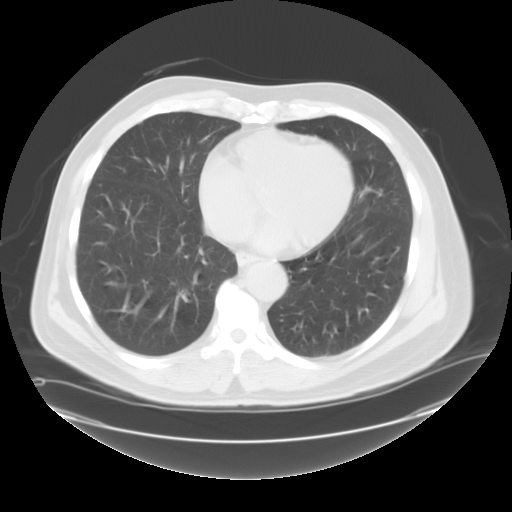
[im 34/56  lung]
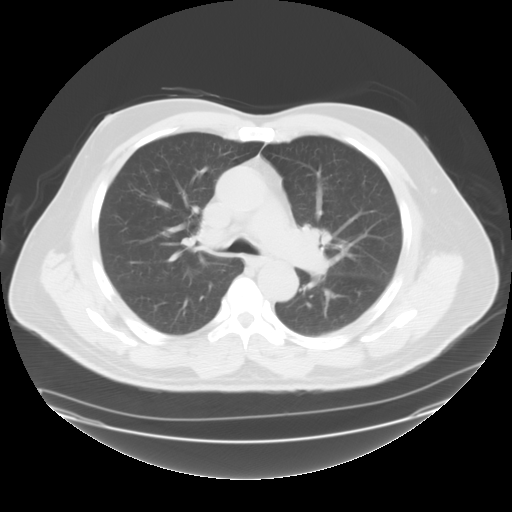
[im 37/56  lung]
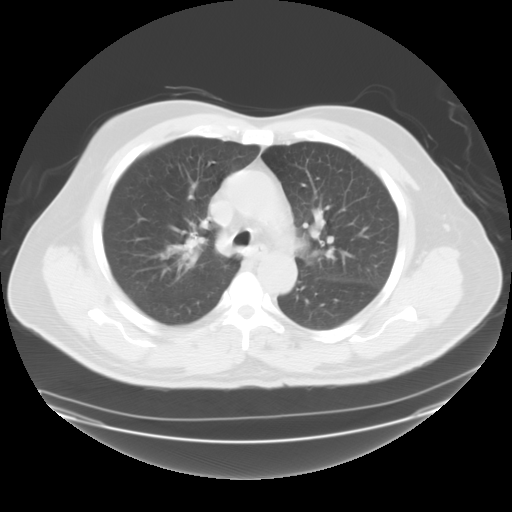

[Series 3: recon 2: routine chest · axial · 0.83mm/px · z∈[-218,-88]mm · 4 of 52 slices shown]
[im 13/52  lung]
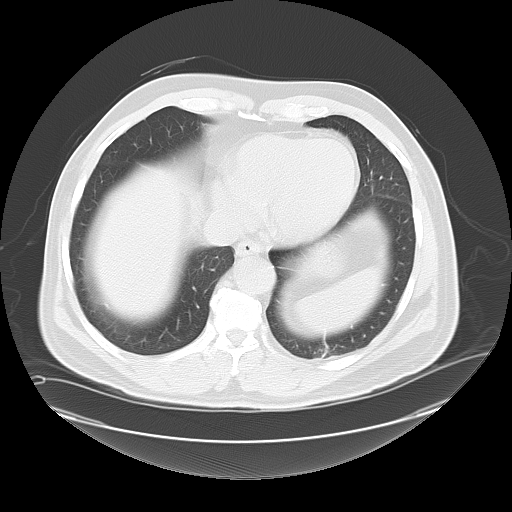
[im 26/52  lung]
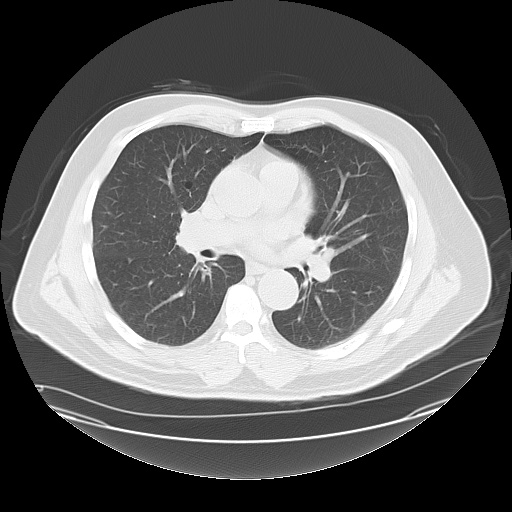
[im 32/52  lung]
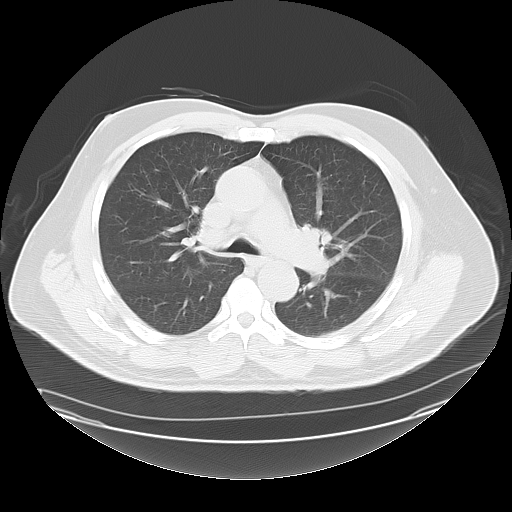
[im 39/52  lung]
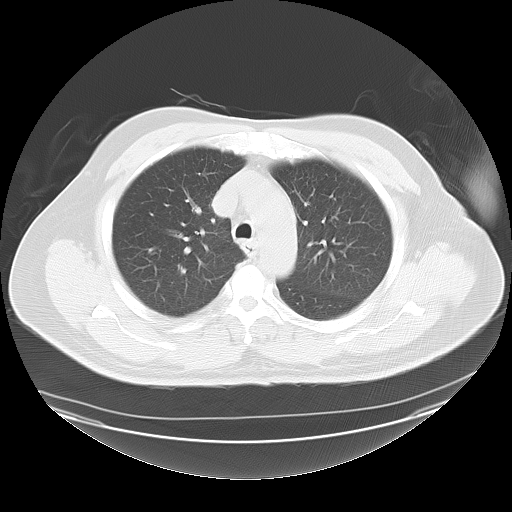

[Series 400: coronals · coronal · 0.83mm/px · 3 of 101 slices shown]
[im 13/101  lung]
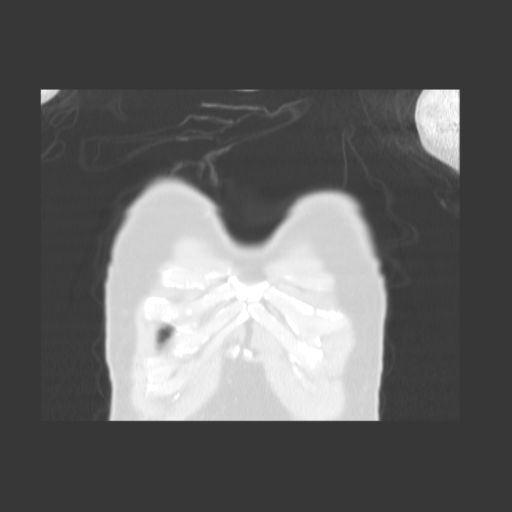
[im 26/101  lung]
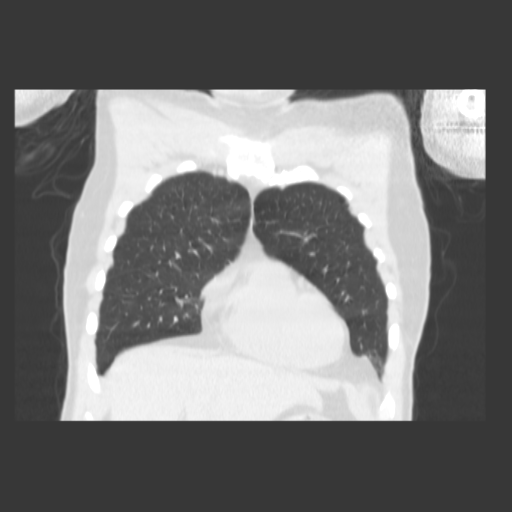
[im 38/101  lung]
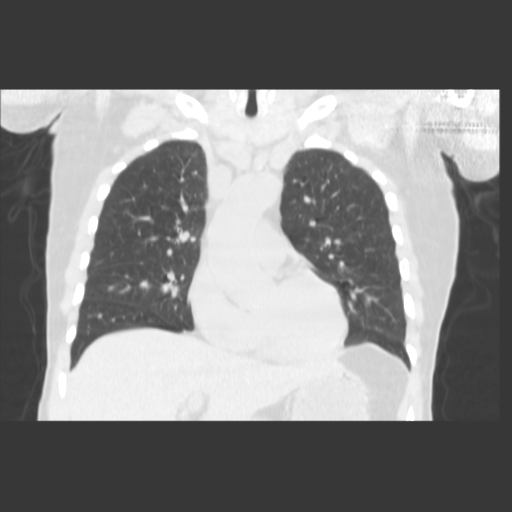

[Series 401: sagittals · sagittal · 0.83mm/px · 8 of 110 slices shown]
[im 13/110  lung]
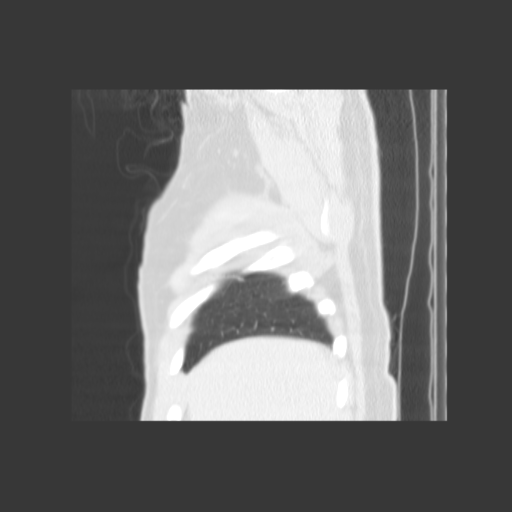
[im 25/110  lung]
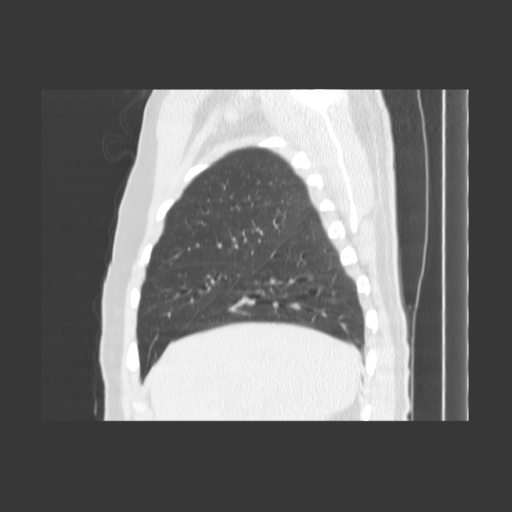
[im 37/110  lung]
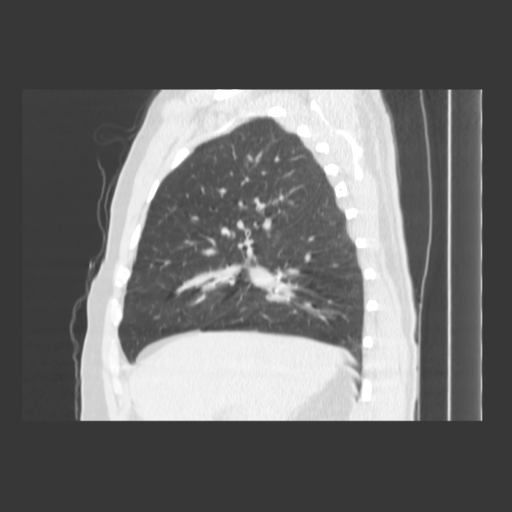
[im 49/110  lung]
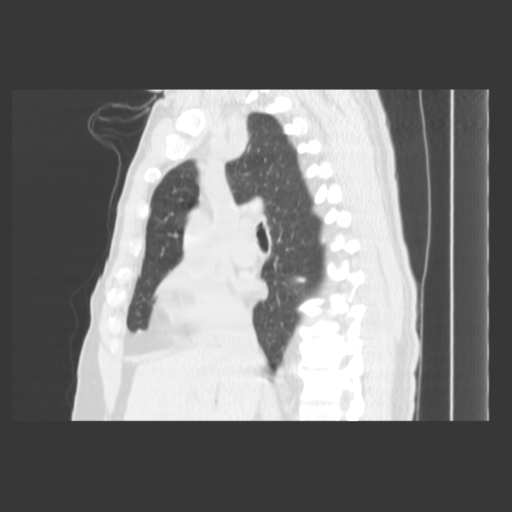
[im 61/110  lung]
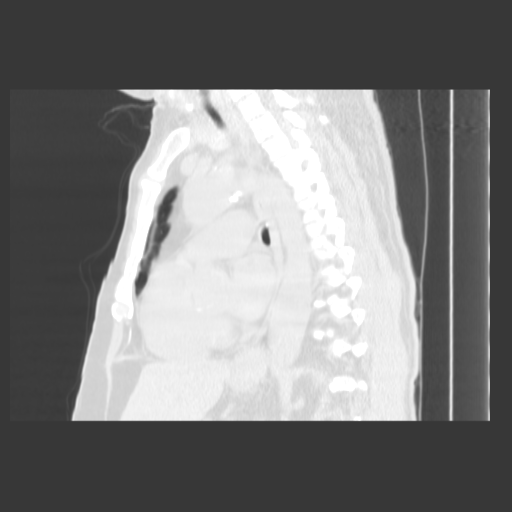
[im 73/110  lung]
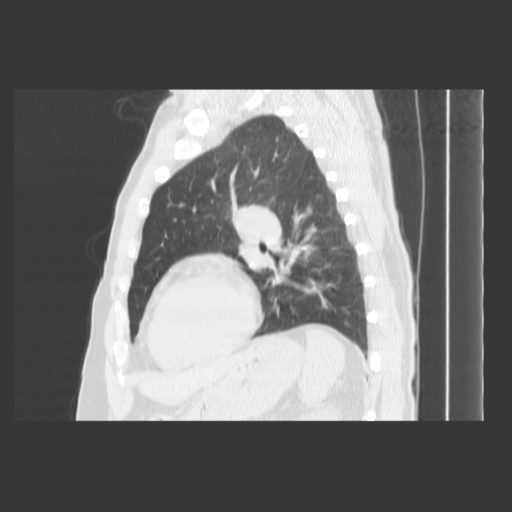
[im 85/110  lung]
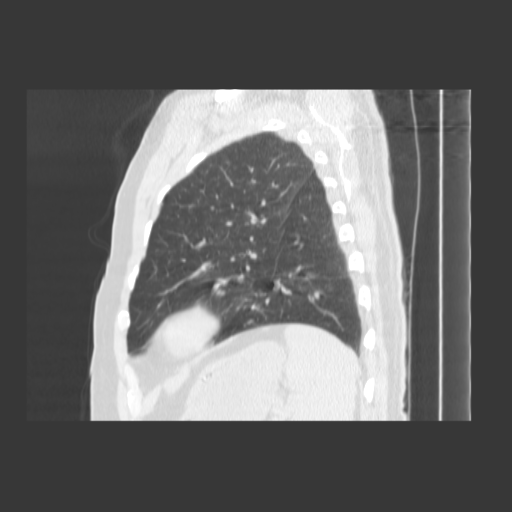
[im 97/110  lung]
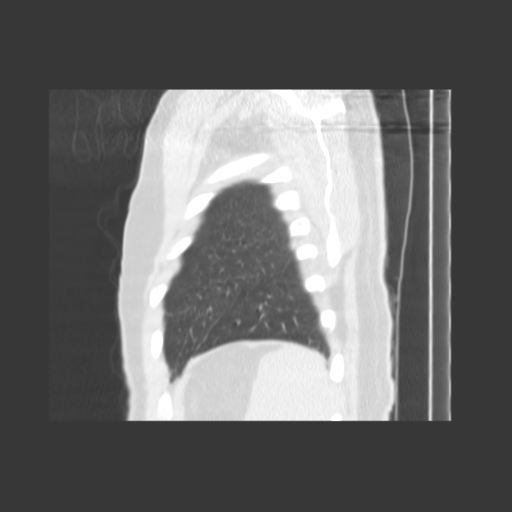

[18 of 30 positions shown; findings below may reference images not displayed]

FINDINGS: Sagittal images of the spine shows mild degenerative
changes lower thoracic spine.  Motion artifact limits evaluation of
the sternum.

Central airways are patent.

The visualized unenhanced abdomen is unremarkable.  Question small
hiatal hernia.

There is no mediastinal hematoma or adenopathy.  Heart size is
within normal limits.  No pericardial effusion.

Images of the lung parenchyma shows no acute infiltrate or pleural
effusion.  No pulmonary edema.  No pulmonary nodules are noted.
Mild central peribronchial thickening suspicious for mild
bronchitic changes.

No aortic aneurysm.  Mild atherosclerotic calcifications of
thoracic aorta.  The unenhanced central pulmonary artery is
unremarkable.

No axillary adenopathy.  No destructive rib lesions are noted.
IMPRESSION: 1.  No acute infiltrate or pulmonary edema.
2.  Mild central peribronchial thickening suspicious for mild
bronchitic changes.
3.  No adenopathy.
4.  Question small hiatal hernia.
5.  Degenerative changes lower thoracic spine.

## 2013-04-24 ENCOUNTER — Telehealth: Payer: Self-pay | Admitting: Family Medicine

## 2013-04-24 NOTE — Telephone Encounter (Signed)
LMOVM for return call.  Please find out the following info when phone call returned:  1. Why is referral needed?   -If just for a cleaning we can fax it in but it will take months to get in  - If he is having a problem then he will need to be seen before referral can be sent. Fleeger, [name]

## 2013-04-24 NOTE — Telephone Encounter (Signed)
Patient needs dental referral. Please fax to [phone]

## 2013-04-24 NOTE — Telephone Encounter (Signed)
Pt called back.  Appt made. Jack Thornton, [name]

## 2013-04-25 ENCOUNTER — Encounter: Payer: Self-pay | Admitting: Family Medicine

## 2013-04-25 ENCOUNTER — Ambulatory Visit (INDEPENDENT_AMBULATORY_CARE_PROVIDER_SITE_OTHER): Payer: No Typology Code available for payment source | Admitting: Family Medicine

## 2013-04-25 VITALS — BP 139/81 | HR 86 | Temp 98.9°F | Ht 65.0 in | Wt 183.0 lb

## 2013-04-25 DIAGNOSIS — Z23 Encounter for immunization: Secondary | ICD-10-CM

## 2013-04-25 DIAGNOSIS — I10 Essential (primary) hypertension: Secondary | ICD-10-CM

## 2013-04-25 DIAGNOSIS — H269 Unspecified cataract: Secondary | ICD-10-CM | POA: Insufficient documentation

## 2013-04-25 DIAGNOSIS — K089 Disorder of teeth and supporting structures, unspecified: Secondary | ICD-10-CM

## 2013-04-25 DIAGNOSIS — C679 Malignant neoplasm of bladder, unspecified: Secondary | ICD-10-CM

## 2013-04-25 DIAGNOSIS — F17200 Nicotine dependence, unspecified, uncomplicated: Secondary | ICD-10-CM

## 2013-04-25 LAB — LIPID PANEL
Cholesterol: 145 mg/dL (ref 0–200)
LDL Cholesterol: 73 mg/dL (ref 0–99)
Total CHOL/HDL Ratio: 4.7 Ratio
VLDL: 41 mg/dL — ABNORMAL HIGH (ref 0–40)

## 2013-04-25 NOTE — Assessment & Plan Note (Signed)
Encouraged cessation (related to reducing risk of future cancers or even recurrence of current cancer). Patient down to 15 cigarettes per day. Advised nicotine patch 14mg  with nicotine gum prn.

## 2013-04-25 NOTE — Patient Instructions (Addendum)
Blood pressure looks good today. I do want to check your cholesterol today. For your labs, I will send you a letter if there are no medication changes needed. I will call you if we need to discuss your lab results.   The most important thing we need to help you with is quitting smoking. Start with a 14mg  patch and use this daily starting the day you want to quit. Also have nicotine gum (2mg ) to use as needed (no more than 5 in a day). See me back 2-4 weeks after you start so we can check in and see how you are doing.   Thanks for the update on the prostate cancer. I am glad you have been doing well through the treatment.   Thanks and see me at least every 3-6 months so we can check in, Dr. Durene Cal

## 2013-04-25 NOTE — Assessment & Plan Note (Addendum)
Well controlled on lisinopril alone (does not seem to be taking cardura). Check lipids today. May benefit from statin (though ? Given age). Also ? Benefit of aspirin although may have been avoided in past due to hematuria.

## 2013-04-25 NOTE — Assessment & Plan Note (Addendum)
1 month out from chemotherapy. Has repeat visit within the next month for CT scan and further planning. Patient with some fatigue since chemotherapy.   Bladder cancer discovered through care everywhere. Patient thought this was prostate cancer. Not sure if patient knows severity of illness (stage IV) will address at next visit.

## 2013-04-25 NOTE — Progress Notes (Signed)
Jack Conch, MD Phone: [phone]  Subjective:  Chief complaint-noted  Hypertension BP Readings from Last 3 Encounters:  04/25/13 139/81  11/29/12 145/74  09/19/12 130/75  Home BP monitoring-no Compliant with medications-yes without side effects ROS-Denies any chest pain or LE edema or headache.   Tobacco abuse Down to 15 cigarettes per day. Wants to quit. Not very confident ROS-some SOB mildly with going upstairs  Bladder Cancer Ongoing chemotherapy at [facility] (last round a month ago and may have finished treatments). Follows up within a month to know if he needs more treatment.will get CT scan at that time.       Past Medical History Patient Active Problem List   Diagnosis Date Noted  . Prostate cancer 09/19/2012    Priority: High  . Urethral stricture requiring dilation and serial catheterization.  01/14/2012    Priority: High  . BPH s/p simple prostatectomy with residual tissue (which has prostate cancer). Followed by [facility] urology.  12/21/2009    Priority: High  . TOBACCO ABUSE 05/21/2009    Priority: Medium  . Essential hypertension, benign 05/21/2009    Priority: Medium  . Cataract 04/25/2013    Priority: Low  . BPPV (benign paroxysmal positional vertigo) 01/18/2012    Priority: Low  . History of ESBL Pyelonephritis requiring extended ertapenem at home.  08/03/2012    Medications- reviewed and updated Current Outpatient Prescriptions  Medication Sig Dispense Refill  . lisinopril (PRINIVIL,ZESTRIL) 20 MG tablet Take 1 tablet (20 mg total) by mouth daily.  30 tablet  11  . doxazosin (CARDURA) 2 MG tablet Take 2 mg by mouth at bedtime.       No current facility-administered medications for this visit.    Objective: BP 139/81  Pulse 86  Temp(Src) 98.9 F (37.2 C) (Oral)  Ht 5\' 5"  (1.651 m)  Wt 183 lb (83.008 kg)  BMI 30.45 kg/m2 Gen: NAD, resting comfortably in chair CV: RRR no murmurs rubs or gallops Teeth: lost bridge for front top incisors,  multiple areas of plaque and likely caries Lungs: CTAB no crackles, wheeze, rhonchi Ext: no edema Skin: warm, dry  Assessment/Plan: Flu shot today. No zostavax as previously on chemotherapy.   Bladder cancer (Stage IV with mets to lungs) 1 month out from chemotherapy. Has repeat visit within the next month for CT scan and further planning. Patient with some fatigue since chemotherapy.   Bladder cancer discovered through care everywhere. Patient thought this was prostate cancer. Not sure if patient knows severity of illness (stage IV) will address at next visit.   TOBACCO ABUSE Encouraged cessation (related to reducing risk of future cancers or even recurrence of current cancer). Patient down to 15 cigarettes per day. Advised nicotine patch 14mg  with nicotine gum prn.   Essential hypertension, benign Well controlled on lisinopril alone (does not seem to be taking cardura). Check lipids today. May benefit from statin (though ? Given age). Also ? Benefit of aspirin although may have been avoided in past due to hematuria.    Poor dentition Multiple caries and lost bridge for front 4 teeth (hasn't been to dentist in 10 years). Referred to dentistry at this time. May take months to a year with orange card.   Orders Placed This Encounter  Procedures  . Lipid panel    Order Specific Question:  Has the patient fasted?    Answer:  No  . Ambulatory referral to Dentistry    Referral Priority:  Routine    Referral Type:  Consultation  Referral Reason:  Specialty Services Required    Requested Specialty:  Dental General Practice    Number of Visits Requested:  1

## 2013-09-30 ENCOUNTER — Emergency Department (HOSPITAL_COMMUNITY)
Admission: EM | Admit: 2013-09-30 | Discharge: 2013-10-01 | Disposition: A | Discharge status: Home / Self Care | Payer: Medicaid Other | Attending: Emergency Medicine | Admitting: Emergency Medicine

## 2013-09-30 ENCOUNTER — Encounter (HOSPITAL_COMMUNITY): Payer: Self-pay | Admitting: Emergency Medicine

## 2013-09-30 DIAGNOSIS — I10 Essential (primary) hypertension: Secondary | ICD-10-CM | POA: Insufficient documentation

## 2013-09-30 DIAGNOSIS — K644 Residual hemorrhoidal skin tags: Secondary | ICD-10-CM | POA: Insufficient documentation

## 2013-09-30 DIAGNOSIS — Z8619 Personal history of other infectious and parasitic diseases: Secondary | ICD-10-CM | POA: Insufficient documentation

## 2013-09-30 DIAGNOSIS — Z79899 Other long term (current) drug therapy: Secondary | ICD-10-CM | POA: Insufficient documentation

## 2013-09-30 DIAGNOSIS — K5900 Constipation, unspecified: Secondary | ICD-10-CM | POA: Insufficient documentation

## 2013-09-30 DIAGNOSIS — Z8546 Personal history of malignant neoplasm of prostate: Secondary | ICD-10-CM | POA: Insufficient documentation

## 2013-09-30 DIAGNOSIS — Z8551 Personal history of malignant neoplasm of bladder: Secondary | ICD-10-CM | POA: Insufficient documentation

## 2013-09-30 DIAGNOSIS — F17200 Nicotine dependence, unspecified, uncomplicated: Secondary | ICD-10-CM | POA: Insufficient documentation

## 2013-09-30 DIAGNOSIS — N390 Urinary tract infection, site not specified: Secondary | ICD-10-CM | POA: Insufficient documentation

## 2013-09-30 HISTORY — DX: Malignant neoplasm of bladder, unspecified: C67.9

## 2013-09-30 LAB — URINALYSIS, ROUTINE W REFLEX MICROSCOPIC
BILIRUBIN URINE: NEGATIVE
GLUCOSE, UA: NEGATIVE mg/dL
KETONES UR: NEGATIVE mg/dL
Nitrite: NEGATIVE
PH: 5 (ref 5.0–8.0)
Protein, ur: 30 mg/dL — AB
Specific Gravity, Urine: 1.015 (ref 1.005–1.030)
Urobilinogen, UA: 0.2 mg/dL (ref 0.0–1.0)

## 2013-09-30 LAB — COMPREHENSIVE METABOLIC PANEL
ALT: 15 U/L (ref 0–53)
AST: 17 U/L (ref 0–37)
Albumin: 3.6 g/dL (ref 3.5–5.2)
Alkaline Phosphatase: 74 U/L (ref 39–117)
BUN: 19 mg/dL (ref 6–23)
CO2: 21 mEq/L (ref 19–32)
Calcium: 9.3 mg/dL (ref 8.4–10.5)
Chloride: 104 mEq/L (ref 96–112)
Creatinine, Ser: 1.21 mg/dL (ref 0.50–1.35)
GFR calc Af Amer: 63 mL/min — ABNORMAL LOW (ref >90)
GFR calc non Af Amer: 54 mL/min — ABNORMAL LOW (ref >90)
Glucose, Bld: 92 mg/dL (ref 70–99)
Potassium: 4.4 mEq/L (ref 3.7–5.3)
Sodium: 138 mEq/L (ref 137–147)
Total Bilirubin: 0.6 mg/dL (ref 0.3–1.2)
Total Protein: 7.4 g/dL (ref 6.0–8.3)

## 2013-09-30 LAB — CBC WITH DIFFERENTIAL/PLATELET
Basophils Absolute: 0 10*3/uL (ref 0.0–0.1)
Basophils Relative: 0 % (ref 0–1)
Eosinophils Absolute: 0.5 10*3/uL (ref 0.0–0.7)
Eosinophils Relative: 9 % — ABNORMAL HIGH (ref 0–5)
HCT: 42.9 % (ref 39.0–52.0)
Hemoglobin: 15.2 g/dL (ref 13.0–17.0)
Lymphocytes Relative: 34 % (ref 12–46)
Lymphs Abs: 1.8 10*3/uL (ref 0.7–4.0)
MCH: 33 pg (ref 26.0–34.0)
MCHC: 35.4 g/dL (ref 30.0–36.0)
MCV: 93.3 fL (ref 78.0–100.0)
Monocytes Absolute: 0.5 10*3/uL (ref 0.1–1.0)
Monocytes Relative: 9 % (ref 3–12)
Neutro Abs: 2.7 10*3/uL (ref 1.7–7.7)
Neutrophils Relative %: 48 % (ref 43–77)
Platelets: 178 10*3/uL (ref 150–400)
RBC: 4.6 MIL/uL (ref 4.22–5.81)
RDW: 13.3 % (ref 11.5–15.5)
WBC: 5.4 10*3/uL (ref 4.0–10.5)

## 2013-09-30 LAB — URINE MICROSCOPIC-ADD ON

## 2013-09-30 LAB — POC OCCULT BLOOD, ED: Fecal Occult Bld: NEGATIVE

## 2013-09-30 MED ORDER — CEPHALEXIN 500 MG PO CAPS: 500.0000 mg | ORAL_CAPSULE | Freq: Two times a day (BID) | ORAL | Status: DC

## 2013-09-30 MED ORDER — PHENYLEPH-SHARK LIV OIL-MO-PET 0.25-3-14-71.9 % RE OINT: 1.0000 "application " | TOPICAL_OINTMENT | Freq: Two times a day (BID) | RECTAL | Status: DC | PRN

## 2013-09-30 MED ORDER — POLYETHYLENE GLYCOL 3350 17 G PO PACK: 17.0000 g | PACK | Freq: Every day | ORAL | Status: DC

## 2013-09-30 NOTE — ED Notes (Signed)
Called main lab in regards to blood work pending, lab states they never received blood work drawn by tyrone at [facility]. Erin updated. Tyrone notified, states blood work was sent.

## 2013-09-30 NOTE — ED Provider Notes (Signed)
CSN: [id number]     Arrival date & time 09/30/13  1712 History   First MD Initiated Contact with Patient 09/30/13 1918     Chief Complaint  Patient presents with  . Constipation     (Consider location/radiation/quality/duration/timing/severity/associated sxs/prior Treatment) Patient is a 80 y.o. male presenting with constipation. The history is provided by the patient and a relative. The history is limited by a language barrier. A language interpreter was used (pt's adult son).  Constipation Associated symptoms: abdominal pain ( lower abdomen) and dysuria   Associated symptoms: no diarrhea, no fever, no nausea and no vomiting    Patient is an 80 year old male with history of prostate cancer and bladder cancer for which he underwent  chemotherapy completed this last December 2014, followed by [facility] has followup appointment next week, however today patient is complaining of dysuria as well as rectal pain following a bowel movement yesterday. Patient states he was constipated for the last 3 days however was able to have a normal bowel movement yesterday.  Patient also complains of lower abdominal pain and is taking mild to moderate in nature. Denies blood in urine. Denies either nausea vomiting diarrhea. Denies recent antibiotic use. Patient's son does report he does get frequent urinary infections.  Past Medical History  Diagnosis Date  . Prostate cancer   . Hypertension   . ACUT GASTR ULCER W/O MENTION HEMORR PERF/OBST 05/21/2009  . Essential hypertension, benign 05/21/2009  . HELICOBACTER PYLORI INFECTION 06/14/2009  . TOBACCO ABUSE 05/21/2009  . 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 Hematuria 08/04/2011    Patient has had hematuria on UA exam even after treatment for urinary tract infection. Does have history of instrumentation done at [facility] for hyperplastic prostate. If continues would consider sending to urology for further workup of the cystoscopy and  . Pyelonephritis  08/03/2012    ESBL with extended ertapenem treatment  . Bladder cancer    Past Surgical History  Procedure Laterality Date  . Prostate surgery  ~ 2008  . Urethral dilation  ~ 2011  . Prostate surgery    . Bladder neck reconstruction    . Urethral dilation     Family History  Problem Relation Age of Onset  . Hypertension      mother and father  . Coronary artery disease      father  . Hypertension Mother   . Hypertension Father   . Heart disease Father    History  Substance Use Topics  . Smoking status: Current Every Day Smoker -- 1.00 packs/day for 50 years    Types: Cigarettes  . Smokeless tobacco: Never Used  . Alcohol Use: No    Review of Systems  Constitutional: Negative for fever and chills.  Respiratory: Negative for shortness of breath.   Cardiovascular: Negative for chest pain.  Gastrointestinal: Positive for abdominal pain ( lower abdomen) and constipation. Negative for nausea, vomiting, diarrhea and blood in stool.  Genitourinary: Positive for dysuria, urgency and frequency. Negative for hematuria, flank pain, decreased urine volume, discharge, penile swelling, scrotal swelling, penile pain and testicular pain.  All other systems reviewed and are negative.     Allergies  Review of patient's allergies indicates no known allergies.  Home Medications   Prior to Admission medications   Medication Sig Start Date End Date Taking? Authorizing Provider  lisinopril (PRINIVIL,ZESTRIL) 20 MG tablet Take 1 tablet (20 mg total) by mouth daily. 11/27/12 11/27/13 Yes Marin Olp, MD  nicotine (RA NICOTINE) 21 mg/24hr patch Place 21 mg onto the skin daily. 08/18/13  Yes Historical Provider, MD  Ranitidine HCl (ZANTAC PO) Take 1 tablet by mouth daily as needed (heartburn).   Yes Historical Provider, MD   BP 135/74  Pulse 72  Temp(Src) 98.8 F (37.1 C) (Oral)  Resp 18  Wt 183 lb (83.008 kg)  SpO2 97% Physical Exam  Nursing note and vitals reviewed. Constitutional:  He appears well-developed and well-nourished.  Pt lying in exam bed, NAD. Appears well, non-toxic.  HENT:  Head: Normocephalic and atraumatic.  Eyes: Conjunctivae are normal. No scleral icterus.  Neck: Normal range of motion.  Cardiovascular: Normal rate, regular rhythm and normal heart sounds.   Pulmonary/Chest: Effort normal and breath sounds normal. No respiratory distress. He has no wheezes. He has no rales. He exhibits no tenderness.  Abdominal: Soft. Bowel sounds are normal. He exhibits no distension and no mass. There is no tenderness. There is no rebound and no guarding.  Soft, non-distended, non-tender. No CVAT  Genitourinary:  Chaperoned exam. Rectal exam-external hemorrhoids. Normal rectal tone.  No fecal impaction.   Musculoskeletal: Normal range of motion.  Neurological: He is alert.  Skin: Skin is warm and dry.    ED Course  Procedures (including critical care time) Labs Review Labs Reviewed  URINALYSIS, ROUTINE W REFLEX MICROSCOPIC - Abnormal; Notable for the following:    APPearance CLOUDY (*)    Hgb urine dipstick LARGE (*)    Protein, ur 30 (*)    Leukocytes, UA LARGE (*)    All other components within normal limits  URINE MICROSCOPIC-ADD ON - Abnormal; Notable for the following:    Bacteria, UA FEW (*)    All other components within normal limits  CBC WITH DIFFERENTIAL - Abnormal; Notable for the following:    Eosinophils Relative 9 (*)    All other components within normal limits  COMPREHENSIVE METABOLIC PANEL - Abnormal; Notable for the following:    GFR calc non Af Amer 54 (*)    GFR calc Af Amer 63 (*)    All other components within normal limits  POC OCCULT BLOOD, ED    Imaging Review No results found.   EKG Interpretation None      MDM   Final diagnoses:  Constipation  UTI (lower urinary tract infection)    Pt brought in by family c/o rectal pain and dysuria. Hx of constipation x3 days. Denies fever, n/v/d. Hx of recurrent UTIs.  Pt  appears well, non-toxic, afebrile. Abd-soft, non-distended, non-tender. Rectal exam-external hemorrhoids, no rectal bleeding. No fecal impaction.  Hemoccult-negative.    UA-consistent with UTI, culture sent.  Will tx with Keflex.  Labs otherwise unremarkable.  Discussed pt with Dr. Aline Brochure who also examined pt. Agrees pt may be discharged home to f/u with PCP. Return precautions provided. Pt and family verbalized understanding and agreement with tx plan.     Noland Fordyce, PA-C 10/01/13 0023

## 2013-09-30 NOTE — ED Notes (Signed)
He was constipated for the past 3 days, he was able to have a bowel movement yesterday and hes had rectal pain since. Hes also had dysuria today

## 2013-09-30 NOTE — ED Notes (Signed)
He has a port

## 2013-09-30 NOTE — ED Notes (Signed)
Pt denies pain.

## 2013-09-30 NOTE — ED Notes (Signed)
Pt would like to use family to interpret

## 2013-10-01 NOTE — ED Provider Notes (Signed)
Medical screening examination/treatment/procedure(s) were conducted as a shared visit with non-physician practitioner(s) and myself.  I personally evaluated the patient during the encounter.   EKG Interpretation None      I interviewed and examined the patient. Lungs are CTAB. Cardiac exam wnl. Abdomen soft w/out any ttp. Rectal performed by PA. Found to have UTI. Labs otherwise unremarkable. Will tx accordingly. Will provide resources for f/u.   Blanchard Kelch, MD 10/01/13 2109

## 2013-10-13 DIAGNOSIS — C7800 Secondary malignant neoplasm of unspecified lung: Secondary | ICD-10-CM | POA: Diagnosis not present

## 2013-10-13 DIAGNOSIS — C679 Malignant neoplasm of bladder, unspecified: Secondary | ICD-10-CM | POA: Diagnosis not present

## 2013-10-13 DIAGNOSIS — F17200 Nicotine dependence, unspecified, uncomplicated: Secondary | ICD-10-CM | POA: Diagnosis not present

## 2013-10-15 DIAGNOSIS — C679 Malignant neoplasm of bladder, unspecified: Secondary | ICD-10-CM | POA: Diagnosis not present

## 2013-10-15 DIAGNOSIS — F17200 Nicotine dependence, unspecified, uncomplicated: Secondary | ICD-10-CM | POA: Diagnosis not present

## 2013-10-15 DIAGNOSIS — C689 Malignant neoplasm of urinary organ, unspecified: Secondary | ICD-10-CM | POA: Diagnosis not present

## 2013-10-15 DIAGNOSIS — C61 Malignant neoplasm of prostate: Secondary | ICD-10-CM | POA: Diagnosis not present

## 2013-10-15 DIAGNOSIS — C7800 Secondary malignant neoplasm of unspecified lung: Secondary | ICD-10-CM | POA: Diagnosis not present

## 2013-10-30 DIAGNOSIS — N400 Enlarged prostate without lower urinary tract symptoms: Secondary | ICD-10-CM | POA: Diagnosis not present

## 2013-10-30 DIAGNOSIS — E049 Nontoxic goiter, unspecified: Secondary | ICD-10-CM | POA: Diagnosis not present

## 2013-10-30 DIAGNOSIS — K7689 Other specified diseases of liver: Secondary | ICD-10-CM | POA: Diagnosis not present

## 2013-10-30 DIAGNOSIS — I10 Essential (primary) hypertension: Secondary | ICD-10-CM | POA: Diagnosis not present

## 2013-10-30 DIAGNOSIS — Q618 Other cystic kidney diseases: Secondary | ICD-10-CM | POA: Diagnosis not present

## 2013-10-30 DIAGNOSIS — C679 Malignant neoplasm of bladder, unspecified: Secondary | ICD-10-CM | POA: Diagnosis not present

## 2013-10-30 DIAGNOSIS — C7800 Secondary malignant neoplasm of unspecified lung: Secondary | ICD-10-CM | POA: Diagnosis not present

## 2013-11-10 ENCOUNTER — Ambulatory Visit (INDEPENDENT_AMBULATORY_CARE_PROVIDER_SITE_OTHER): Payer: No Typology Code available for payment source | Admitting: Family Medicine

## 2013-11-10 ENCOUNTER — Encounter: Payer: Self-pay | Admitting: Family Medicine

## 2013-11-10 VITALS — BP 165/81 | HR 79 | Temp 98.1°F | Wt 175.0 lb

## 2013-11-10 DIAGNOSIS — N390 Urinary tract infection, site not specified: Secondary | ICD-10-CM

## 2013-11-10 DIAGNOSIS — R300 Dysuria: Secondary | ICD-10-CM

## 2013-11-10 DIAGNOSIS — T83511A Infection and inflammatory reaction due to indwelling urethral catheter, initial encounter: Secondary | ICD-10-CM

## 2013-11-10 LAB — POCT URINALYSIS DIPSTICK
BILIRUBIN UA: NEGATIVE
Glucose, UA: NEGATIVE
Ketones, UA: NEGATIVE
NITRITE UA: POSITIVE
PH UA: 5.5
Protein, UA: 100
SPEC GRAV UA: 1.02
Urobilinogen, UA: 0.2

## 2013-11-10 LAB — POCT UA - MICROSCOPIC ONLY

## 2013-11-10 MED ORDER — CEPHALEXIN 500 MG PO CAPS: 500.0000 mg | ORAL_CAPSULE | Freq: Four times a day (QID) | ORAL | Status: DC

## 2013-11-10 NOTE — Patient Instructions (Signed)
It was nice seeing you again today.  UTI - please take medication as prescribed until prescription is empty. (4 times a day with food)  Dysuria - Schedule appointment with your urologist at [facility] immediately.  Please see [place] or visit nearest ER if you experience any increase or worsening of symptoms, fever, chills, worsening back/flank pain.

## 2013-11-11 NOTE — Progress Notes (Signed)
Garret Reddish, MD Phone: [phone]  Subjective:   Jack Thornton is a 80 y.o. year old very pleasant male patient who presents with the following:  Dysuria Patient seen in ED on 09/30/13 for dysuria and diagnosed with UTI. Culture was planned to be sent but was not. Patient placed on keflex 500 BID at that time for 7 days. Patient noted some improvement on antibiotic and resolution for at least a week after treatment. Symptoms returned 5-6 days ago and included polyuria, dysuria. Patient stopped being able to self cath about 3 days ago due to level of pain when trying to insert. He does have a history of strictures requiring dilation.  Patient was also diagnosed wit hemorrhoids in ED after rectal exam and normal hemocult and was given miralax. Patient has had return of these symptoms 3 days ago. He tried preparation H x 1 with some mild relief and symptoms are improving today. Previously difficulty sitting down due to pain.   ROS- no flank pain/cva tenderness. No fever/chills/nausea/vomiting.   Past Medical History- Stage IV bladder cancer with mets to lung (recently had radiation), history of BPH with simple prostatectomy which was later discovered to have residual prostate cancer, tobacco abuse, hypertension, history BPPV, history of ESBL pyelonephritis  Medications- reviewed and updated Current Outpatient Prescriptions  Medication Sig Dispense Refill  . cephALEXin (KEFLEX) 500 MG capsule Take 1 capsule (500 mg total) by mouth 4 (four) times daily.  40 capsule  0  . lisinopril (PRINIVIL,ZESTRIL) 20 MG tablet Take 1 tablet (20 mg total) by mouth daily.  30 tablet  11  . nicotine (RA NICOTINE) 21 mg/24hr patch Place 21 mg onto the skin daily.      . phenylephrine-shark liver oil-mineral oil-petrolatum (PREPARATION H) 0.25-3-14-71.9 % rectal ointment Place 1 application rectally 2 (two) times daily as needed for hemorrhoids.  30 g  0  . polyethylene glycol (MIRALAX) packet Take 17 g by mouth  daily.  14 each  0  . Ranitidine HCl (ZANTAC PO) Take 1 tablet by mouth daily as needed (heartburn).       No current facility-administered medications for this visit.    Objective: BP 165/81  Pulse 79  Temp(Src) 98.1 F (36.7 C) (Oral)  Wt 175 lb (79.379 kg) Gen: NAD, resting comfortably in chair CV: RRR no murmurs rubs or gallops Lungs: CTAB no crackles, wheeze, rhonchi Back: no CVA tenderness Abdomen: soft/nontender/nondistended/normal bowel sounds. No rebound or guarding. No suprapubic pain. Rectal: external hemorrhoid at 9 o clock noted, mildly painful to touch, no pain on internal exam with palpation of prostate.  Ext: trace edema Skin: warm, dry, no rash  Neuro: good rectal tone  Results for orders placed in visit on 11/10/13 (from the past 48 hour(s))  POCT URINALYSIS DIPSTICK     Status: Abnormal   Collection Time    11/10/13  2:02 PM      Result Value Ref Range   Color, UA YELLOW     Clarity, UA CLOUDY     Glucose, UA NEG     Bilirubin, UA NEG     Ketones, UA NEG     Spec Grav, UA 1.020     Blood, UA LARGE     pH, UA 5.5     Protein, UA 100     Urobilinogen, UA 0.2     Nitrite, UA POSITIVE     Leukocytes, UA large (3+)    POCT UA - MICROSCOPIC ONLY     Status: Abnormal  Collection Time    11/10/13  2:02 PM      Result Value Ref Range   WBC, Ur, HPF, POC LOADED     RBC, urine, microscopic LOADED     Bacteria, U Microscopic LOADED     Epithelial cells, urine per micros 1-5     Assessment/Plan:  Dysuria I suspect this represents a UTI but UA findings could be found at any point given bladder cancer. Rectal exam without signs of prostatitis. Suspect this is constipation so encouraged miralax and preparation H. Some improvement on keflex previously (likely would have benefited from TID or QID dosing). We have restarted this for patient but at QID x 10 days. Urine culture sent (none at ED to evaluate). I know that patient has risk factors for multidrug  resistant organisms so he will return if symptoms worsen while we are awaiting culture. I am also going go forward this note to new PCP Dr. Jerline Pain to follow urine culture closely. Given history of stricture and fact patient can no longer self cath at this point, I have asked him to return to [place] for evaluation. Primary follow up with [facility] urology though patient can follow up with [place] prn and within next month to meet new PCP.   Meds ordered this encounter  Medications  . cephALEXin (KEFLEX) 500 MG capsule    Sig: Take 1 capsule (500 mg total) by mouth 4 (four) times daily.    Dispense:  40 capsule    Refill:  0    Order Specific Question:  Supervising Provider    Answer:  Noemi Chapel D [[id number]]

## 2013-11-13 LAB — URINE CULTURE

## 2013-11-14 ENCOUNTER — Other Ambulatory Visit: Payer: Self-pay | Admitting: Family Medicine

## 2013-11-14 ENCOUNTER — Telehealth: Payer: Self-pay | Admitting: Family Medicine

## 2013-11-14 ENCOUNTER — Encounter: Payer: Self-pay | Admitting: Family Medicine

## 2013-11-14 NOTE — Telephone Encounter (Signed)
Attempted to call patient. Spoke with the patient's son. Communicated to him that the current antibiotic he is on is not treating his UTI. [facility]ed [facility]. Ran this by the inpatient team. Will follow up regarding sending new prescription to health department vs  outpatient pharmacy.

## 2013-11-15 ENCOUNTER — Other Ambulatory Visit: Payer: Self-pay | Admitting: Family Medicine

## 2013-11-15 MED ORDER — AMOXICILLIN-POT CLAVULANATE 875-125 MG PO TABS: 1.0000 | ORAL_TABLET | Freq: Two times a day (BID) | ORAL | Status: DC

## 2013-11-15 NOTE — Progress Notes (Signed)
Called pt's son to inform him that the pharmacy he requested for his father's abx are closed over the weekend ([facility] dept) and sent Augmentin Rx to [facility]. He spoke with his father who is not currently having fevers or back pain, but continues to have dysuria. Advised him to call/make apt at clinic if he develops fever or back pain.   Jack Thornton

## 2013-11-20 DIAGNOSIS — N400 Enlarged prostate without lower urinary tract symptoms: Secondary | ICD-10-CM | POA: Diagnosis not present

## 2013-11-20 DIAGNOSIS — I10 Essential (primary) hypertension: Secondary | ICD-10-CM | POA: Diagnosis not present

## 2013-11-20 DIAGNOSIS — Z8711 Personal history of peptic ulcer disease: Secondary | ICD-10-CM | POA: Diagnosis not present

## 2013-11-20 DIAGNOSIS — Z8551 Personal history of malignant neoplasm of bladder: Secondary | ICD-10-CM | POA: Diagnosis not present

## 2013-11-20 DIAGNOSIS — N35919 Unspecified urethral stricture, male, unspecified site: Secondary | ICD-10-CM | POA: Diagnosis not present

## 2013-11-25 ENCOUNTER — Ambulatory Visit (INDEPENDENT_AMBULATORY_CARE_PROVIDER_SITE_OTHER): Payer: Medicare Other | Admitting: Family Medicine

## 2013-11-25 ENCOUNTER — Encounter: Payer: Self-pay | Admitting: Family Medicine

## 2013-11-25 VITALS — BP 123/81 | HR 82 | Temp 98.3°F | Wt 180.0 lb

## 2013-11-25 DIAGNOSIS — R300 Dysuria: Secondary | ICD-10-CM | POA: Diagnosis not present

## 2013-11-25 DIAGNOSIS — K6289 Other specified diseases of anus and rectum: Secondary | ICD-10-CM | POA: Diagnosis not present

## 2013-11-25 DIAGNOSIS — C679 Malignant neoplasm of bladder, unspecified: Secondary | ICD-10-CM | POA: Diagnosis not present

## 2013-11-25 DIAGNOSIS — N390 Urinary tract infection, site not specified: Secondary | ICD-10-CM | POA: Diagnosis not present

## 2013-11-25 DIAGNOSIS — T83511A Infection and inflammatory reaction due to indwelling urethral catheter, initial encounter: Secondary | ICD-10-CM | POA: Diagnosis not present

## 2013-11-25 DIAGNOSIS — K602 Anal fissure, unspecified: Secondary | ICD-10-CM

## 2013-11-25 DIAGNOSIS — N400 Enlarged prostate without lower urinary tract symptoms: Secondary | ICD-10-CM

## 2013-11-25 MED ORDER — NITROGLYCERIN 0.4 % RE OINT: TOPICAL_OINTMENT | RECTAL | Status: DC

## 2013-11-25 NOTE — Assessment & Plan Note (Addendum)
Digital rectal exam and anoscopy performed. Small fissure seen around 2 o'clock with small internal hemorrhoid with no inflammation. Nitroglycerin topical prescribed. Continue Miralax for constipation prn. I referred to GI for assessment for possible colonoscopy due to hx of bladder cancer. F/U soon if worsening or in 4 wks with PCP.

## 2013-11-25 NOTE — Assessment & Plan Note (Signed)
S/P chemotherapy. Seem stable at this time. Continue management with urologist.

## 2013-11-25 NOTE — Patient Instructions (Signed)
It was nice seeing you today Jack Thornton, I am sorry you have pain in your anal area, it seem you have some tear in your anal area which is causing pain and bleeding, constipation may worsen this, hence continue Miralax to soften your stool. I have also prescribed Nitroglycerin to relieve your pain and help with healing. Due to history of cancer, I will like for you to see a gastroenterologist to make sure nothing else is going on, please follow up with your PCP soon.  Anal Fissure, Adult An anal fissure is a small tear or crack in the skin around the opening of the butt (anus).Bleeding from the tear or crack usually stops on its own within a few minutes. The bleeding may happen every time you poop until the tear or crack heals. HOME CARE  Eat lots of fruit, whole grains, and vegetables. Avoid foods like bananas and dairy products. These foods can make it hard to poop.  Take a warm water bath (sitz bath) as told by your doctor.  Drink enough fluids to keep your pee (urine) clear or pale yellow.  Only take medicines as told by your doctor. Do not take aspirin.  Do not use numbing creams or hydrocortisone cream on the area. These creams can slow healing. GET HELP RIGHT AWAY IF:  Your tear or crack is not healed in 3 days.  You have more bleeding.  You have a fever.  You have watery poop (diarrhea) mixed with blood.  You have pain.  You are getting worse, not better. MAKE SURE YOU:   Understand these instructions.  Will watch your condition.  Will get help right away if you are not doing well or get worse. Document Released: 12/28/2010 Document Revised: 07/24/2011 Document Reviewed: 12/28/2010 [facility] Patient Information 2015 [place], [place]. This information is not intended to replace advice given to you by your health care provider. Make sure you discuss any questions you have with your health care provider.

## 2013-11-25 NOTE — Progress Notes (Deleted)
Subjective:     Patient ID: Jack Thornton, male   DOB: 12-16-1929, 80 y.o.   MRN: [id number]  HPI Rectal pain:   Review of Systems     Objective:   Physical Exam     Assessment:     ***    Plan:     ***

## 2013-11-25 NOTE — Assessment & Plan Note (Signed)
S/P Prostatectomy in 2008 which caused incontinence. S/P antibiotic coverage for possible UTI. Currently with urine catheter and seem stable although some irritation with catheter. Tylenol prn pain. F/U with Urologist as instructed.

## 2013-11-25 NOTE — Progress Notes (Signed)
Subjective:     Patient ID: Jack Thornton, male   DOB: 1929/08/06, 80 y.o.   MRN: [id number]  HPI Rectal pain: Pain here today c/o pain in his rectum, which feels like a tear whenever he is moving his bowel, this has been worsening over the few weeks,he denies blood in his stool,he does have constipation for which he uses Miralax regularly, he denies any abdominal pain. BPH/Bladder cancer: Patient recently seen by his urologist for increase urine frequency, he was seen few days ago where he had urinary catheter placed, this has helped with his symptoms but he feels suprapubic pain. He stated he is scheduled to follow up with his urologist in about 2 wks. He is s/p radiation therapy for his bladder C.A as per his son. He recently completed Augmentin anf Keflex for UTI.  Current Outpatient Prescriptions on File Prior to Visit  Medication Sig Dispense Refill  . lisinopril (PRINIVIL,ZESTRIL) 20 MG tablet Take 1 tablet (20 mg total) by mouth daily.  30 tablet  11  . cephALEXin (KEFLEX) 500 MG capsule Take 1 capsule (500 mg total) by mouth 4 (four) times daily.  40 capsule  0  . nicotine (RA NICOTINE) 21 mg/24hr patch Place 21 mg onto the skin daily.      . phenylephrine-shark liver oil-mineral oil-petrolatum (PREPARATION H) 0.25-3-14-71.9 % rectal ointment Place 1 application rectally 2 (two) times daily as needed for hemorrhoids.  30 g  0  . polyethylene glycol (MIRALAX) packet Take 17 g by mouth daily.  14 each  0  . Ranitidine HCl (ZANTAC PO) Take 1 tablet by mouth daily as needed (heartburn).       No current facility-administered medications on file prior to visit.   Past Medical History  Diagnosis Date  . Prostate cancer   . Hypertension   . ACUT GASTR ULCER W/O MENTION HEMORR PERF/OBST 05/21/2009  . Essential hypertension, benign 05/21/2009  . HELICOBACTER PYLORI INFECTION 06/14/2009  . TOBACCO ABUSE 05/21/2009  . 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 Hematuria 08/04/2011   Patient has had hematuria on UA exam even after treatment for urinary tract infection. Does have history of instrumentation done at [facility] for hyperplastic prostate. If continues would consider sending to urology for further workup of the cystoscopy and  . Pyelonephritis 08/03/2012    ESBL with extended ertapenem treatment  . Bladder cancer       Review of Systems  Respiratory: Negative.   Cardiovascular: Negative.   Gastrointestinal: Positive for constipation and rectal pain. Negative for vomiting and diarrhea.       Suprapubic pain  Genitourinary:       Urinary symptoms currently with catheter.  All other systems reviewed and are negative.  Filed Vitals:   11/25/13 0849  BP: 123/81  Pulse: 82  Temp: 98.3 F (36.8 C)  TempSrc: Oral  Weight: 180 lb (81.647 kg)       Objective:   Physical Exam  Nursing note and vitals reviewed. Constitutional: He appears well-developed. No distress.  Cardiovascular: Normal rate, regular rhythm, normal heart sounds and intact distal pulses.   No murmur heard. Pulmonary/Chest: Effort normal and breath sounds normal. No respiratory distress. He has no wheezes.  Abdominal: Soft. He exhibits no distension and no mass. There is no tenderness. There is no rebound and no guarding.  Genitourinary: Rectal exam shows internal hemorrhoid, fissure and tenderness. Rectal exam shows no mass and anal tone normal. Guaiac positive stool.  Urinary  catheter in place. Anoscopy performed in addition to rectal digital exam. Presence of small fissure with small blood.       Assessment:     Anal pain: Anal fissure BPH Bladder cancer     Plan:     Check problem list      Total time spent on face to face encounter and coordination of care was more than 30 min.

## 2013-12-01 ENCOUNTER — Encounter: Payer: Self-pay | Admitting: Gastroenterology

## 2013-12-04 DIAGNOSIS — C7919 Secondary malignant neoplasm of other urinary organs: Secondary | ICD-10-CM | POA: Diagnosis not present

## 2013-12-04 DIAGNOSIS — R339 Retention of urine, unspecified: Secondary | ICD-10-CM | POA: Diagnosis not present

## 2013-12-04 DIAGNOSIS — N35919 Unspecified urethral stricture, male, unspecified site: Secondary | ICD-10-CM | POA: Diagnosis not present

## 2013-12-04 DIAGNOSIS — C801 Malignant (primary) neoplasm, unspecified: Secondary | ICD-10-CM | POA: Diagnosis not present

## 2013-12-09 ENCOUNTER — Encounter: Payer: Self-pay | Admitting: Gastroenterology

## 2013-12-09 ENCOUNTER — Ambulatory Visit (INDEPENDENT_AMBULATORY_CARE_PROVIDER_SITE_OTHER): Payer: Medicare Other | Admitting: Gastroenterology

## 2013-12-09 VITALS — BP 134/80 | HR 76 | Ht 65.0 in | Wt 180.0 lb

## 2013-12-09 DIAGNOSIS — K602 Anal fissure, unspecified: Secondary | ICD-10-CM | POA: Insufficient documentation

## 2013-12-09 DIAGNOSIS — K5900 Constipation, unspecified: Secondary | ICD-10-CM | POA: Insufficient documentation

## 2013-12-09 MED ORDER — LUBIPROSTONE 8 MCG PO CAPS: 8.0000 ug | ORAL_CAPSULE | Freq: Two times a day (BID) | ORAL | Status: DC

## 2013-12-09 MED ORDER — DILTIAZEM GEL 2 %: 1.0000 "application " | Freq: Two times a day (BID) | CUTANEOUS | Status: DC

## 2013-12-09 NOTE — Progress Notes (Signed)
12/09/2013 Jack Thornton [id number] [birth year]   HISTORY OF PRESENT ILLNESS:  This is an 80 year old [place] male who has a past medical history of metastatic bladder cancer (received radiation, chemo, and had some surgeries).  He is here today with his son and a Optometrist for interpretation.  The patient is here today for a couple of issues. First, he complains of constipation. This is apparently a new issue and has only been present for a few weeks. He tried Office manager, but his son reports that he only took it for a couple of days before stopping it. Also, he has been complaining of burning in his rectum when having a bowel movement. He saw his PCP where exam was performed and he was diagnosed with hemorrhoids and an anal fissure.  He was treated with Preparation H and Rectiv (nitroglycerin).  He says that the Preparation H. did not help.  He only used the nitroglycerin a couple of times because he felt like it made it burn more. He complains that it hurts when he sits down as well. He denies seeing blood in his stool.  He has never had a colonoscopy and we discussed this briefly.  We decided to hold off with that for now and re-discuss at his follow-up visit if needed.   Past Medical History  Diagnosis Date  . Prostate cancer   . Hypertension   . ACUT GASTR ULCER W/O MENTION HEMORR PERF/OBST 05/21/2009  . Essential hypertension, benign 05/21/2009  . HELICOBACTER PYLORI INFECTION 06/14/2009  . TOBACCO ABUSE 05/21/2009  . 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 Hematuria 08/04/2011    Patient has had hematuria on UA exam even after treatment for urinary tract infection. Does have history of instrumentation done at [facility] for hyperplastic prostate. If continues would consider sending to urology for further workup of the cystoscopy and  . Pyelonephritis 08/03/2012    ESBL with extended ertapenem treatment  . Bladder cancer    Past Surgical History  Procedure Laterality Date    . Prostate surgery  ~ 2008  . Urethral dilation  ~ 2011  . Prostate surgery    . Bladder neck reconstruction    . Urethral dilation      reports that he has been smoking Cigarettes.  He has a 50 pack-year smoking history. He has never used smokeless tobacco. He reports that he does not drink alcohol or use illicit drugs. family history includes Coronary artery disease in an other family member; Heart disease in his father; Hypertension in his father, mother, and another family member. No Known Allergies    Outpatient Encounter Prescriptions as of 12/09/2013  Medication Sig  . nicotine (RA NICOTINE) 21 mg/24hr patch Place 21 mg onto the skin daily.  . polyethylene glycol (MIRALAX) packet Take 17 g by mouth daily.  . Ranitidine HCl (ZANTAC PO) Take 1 tablet by mouth daily as needed (heartburn).  Marland Kitchen diltiazem 2 % GEL Apply 1 application topically 2 (two) times daily.  Marland Kitchen lubiprostone (AMITIZA) 8 MCG capsule Take 1 capsule (8 mcg total) by mouth 2 (two) times daily with a meal.  . [DISCONTINUED] cephALEXin (KEFLEX) 500 MG capsule Take 1 capsule (500 mg total) by mouth 4 (four) times daily.  . [DISCONTINUED] Nitroglycerin 0.4 % OINT Apply 1 inch intra-anally every 12 hrs  . [DISCONTINUED] phenylephrine-shark liver oil-mineral oil-petrolatum (PREPARATION H) 0.25-3-14-71.9 % rectal ointment Place 1 application rectally 2 (two) times daily as needed  for hemorrhoids.     REVIEW OF SYSTEMS  : All other systems reviewed and negative except where noted in the History of Present Illness.   PHYSICAL EXAM: BP 134/80  Pulse 76  Ht 5\' 5"  (1.651 m)  Wt 180 lb (81.647 kg)  BMI 29.95 kg/m2 General: Well developed male in no acute distress Head: Normocephalic and atraumatic Eyes:  Sclerae anicteric, conjunctiva pink. Ears: Normal auditory acuity  Lungs: Clear throughout to auscultation Heart: Regular rate and rhythm Abdomen: Soft, non-distended.  Normal bowel sounds.  Non-tender. Rectal:   Hemorrhoid noted.  DRE did not reveal any masses.  Small amount of light brown stool noted on exam glove with heme negative stool.  Anoscopy attempted but not completed due to pain, but internal hemorrhoids noted. Musculoskeletal: Symmetrical with no gross deformities  Skin: No lesions on visible extremities Extremities: No edema  Neurological: Alert oriented x 4, grossly non-focal Psychological:  Alert and cooperative. Normal mood and affect  ASSESSMENT AND PLAN: -Suspected anal fissure:  He did not like using the nitroglycerin gel so he discontinued it after a short time.  We will try diltiazem gel 2% BID.  I have also asked him to apply lidocaine cream/gel (which he has at home) prior to using the diltiazem for comfort.  He was advised that both of these need to be inserted about an inch into the anal canal. -Constipation:  Tried Miralax only for a couple of days.  Son is asking for something pill/capsule form.  Will try amitiza 8 mcg BID with food.    *Patient's son was present as well as a Optometrist for interpretation, however, there was still a communication barrier.  I told the son that his father must continue to try these medications for more than just a day or two in order to know if they will work.  Patient will return for follow-up in 4-6 weeks.

## 2013-12-09 NOTE — Patient Instructions (Signed)
We sent two prescriptions  To [facility]. 1. Diltiazem 2% cream , Use the lidocaine gel first before the Diltiazem cream.  2. [facility] Address: [place], [place]. Phone: [phone]

## 2013-12-09 NOTE — Progress Notes (Signed)
Reviewed and agree.

## 2013-12-11 ENCOUNTER — Encounter: Payer: Self-pay | Admitting: Family Medicine

## 2013-12-11 ENCOUNTER — Ambulatory Visit (INDEPENDENT_AMBULATORY_CARE_PROVIDER_SITE_OTHER): Payer: Medicare Other | Admitting: Family Medicine

## 2013-12-11 ENCOUNTER — Ambulatory Visit: Payer: No Typology Code available for payment source | Admitting: Family Medicine

## 2013-12-11 VITALS — BP 168/80 | HR 87 | Temp 98.3°F | Wt 179.0 lb

## 2013-12-11 DIAGNOSIS — I10 Essential (primary) hypertension: Secondary | ICD-10-CM

## 2013-12-11 DIAGNOSIS — R300 Dysuria: Secondary | ICD-10-CM | POA: Diagnosis not present

## 2013-12-11 LAB — POCT URINALYSIS DIPSTICK
BILIRUBIN UA: NEGATIVE
Glucose, UA: NEGATIVE
KETONES UA: NEGATIVE
Nitrite, UA: POSITIVE
Protein, UA: 30
SPEC GRAV UA: 1.01
Urobilinogen, UA: 0.2
pH, UA: 6

## 2013-12-11 LAB — POCT UA - MICROSCOPIC ONLY

## 2013-12-11 MED ORDER — AMOXICILLIN-POT CLAVULANATE 500-125 MG PO TABS: 1.0000 | ORAL_TABLET | Freq: Three times a day (TID) | ORAL | Status: AC

## 2013-12-11 NOTE — Assessment & Plan Note (Addendum)
UA consistent with UTI. No signs of pyelonephritis. UTI last month sensitive to only augmentin, imipenem, and zosyn. Will treat empirically today with augmentin. Will follow up culture results and also fax results to [facility] urology: Dr Laureen Abrahams [phone]

## 2013-12-11 NOTE — Patient Instructions (Addendum)
Thank you for coming to the clinic today.  For your pain with urination, we took a sample of your urine which showed signs of infection. We sent a prescription for Augmentin to your pharmacy.  For your high blood pressure, I would like for you to make sure to take your medicine, and come in next week for a blood pressure check. If it is still elevated, we may have to start a new medication

## 2013-12-11 NOTE — Assessment & Plan Note (Signed)
Elevated to 182/100 initially today. 168/80 on recheck. Did not take lisinopril this morning. Will recheck in 1 week after taking meds, and consider adding med or increasing dose if still elevated.

## 2013-12-11 NOTE — Progress Notes (Signed)
Jack Thornton is a 80 y.o. male who presents to [facility] today for follow up. His concerns today include: dysuria  HPI: Dysuria: Patient has a long history of urologic complications, including prostatic cancer s/p radiation, now with urethral strictures and several multi drug resistant UTIs. Currently being followed at [facility]. Last had a UTI about a month ago. Did well after antibiotics. Had indwelling foley placed for 2 weeks, removed 1 week ago. Did well until 3 days ago when burning began again. No fevers or chills. No back pain. No nausea or vomiting.  Hypertension Patient also had elevated BP today during clinic. Did not take his lisinopril this morning. No chest pain or shortness of breath. No weakness or numbness.  Past Medical History  Diagnosis Date  . Prostate cancer   . Hypertension   . ACUT GASTR ULCER W/O MENTION HEMORR PERF/OBST 05/21/2009  . Essential hypertension, benign 05/21/2009  . HELICOBACTER PYLORI INFECTION 06/14/2009  . TOBACCO ABUSE 05/21/2009  . 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 Hematuria 08/04/2011    Patient has had hematuria on UA exam even after treatment for urinary tract infection. Does have history of instrumentation done at [facility] for hyperplastic prostate. If continues would consider sending to urology for further workup of the cystoscopy and  . Pyelonephritis 08/03/2012    ESBL with extended ertapenem treatment  . Bladder cancer     ROS as above.    Medications reviewed. Current Outpatient Prescriptions  Medication Sig Dispense Refill  . amoxicillin-clavulanate (AUGMENTIN) 500-125 MG per tablet Take 1 tablet (500 mg total) by mouth 3 (three) times daily.  30 tablet  0  . diltiazem 2 % GEL Apply 1 application topically 2 (two) times daily.  30 g  1  . lisinopril (PRINIVIL,ZESTRIL) 20 MG tablet Take 20 mg by mouth daily.      Marland Kitchen lubiprostone (AMITIZA) 8 MCG capsule Take 1 capsule (8 mcg total) by mouth 2 (two) times daily with a meal.   60 capsule  2  . Ranitidine HCl (ZANTAC PO) Take 1 tablet by mouth daily as needed (heartburn).      . nicotine (RA NICOTINE) 21 mg/24hr patch Place 21 mg onto the skin daily.      . polyethylene glycol (MIRALAX) packet Take 17 g by mouth daily.  14 each  0   No current facility-administered medications for this visit.    Exam:  BP 168/80  Pulse 87  Temp(Src) 98.3 F (36.8 C) (Oral)  Wt 179 lb (81.194 kg) Gen: Well NAD HEENT: EOMI,  MMM Lungs: CTABL Nl WOB Heart: RRR no MRG Back: No CVA tenderness Abd: NABS, NT, ND Exts: Non edematous BL  LE, warm and well perfused.   Results for orders placed in visit on 12/11/13 (from the past 72 hour(s))  POCT URINALYSIS DIPSTICK     Status: Abnormal   Collection Time    12/11/13  3:57 PM      Result Value Ref Range   Color, UA YELLOW     Clarity, UA CLOUDY     Glucose, UA NEG     Bilirubin, UA NEG     Ketones, UA NEG     Spec Grav, UA 1.010     Blood, UA LARGE     pH, UA 6.0     Protein, UA 30     Urobilinogen, UA 0.2     Nitrite, UA POSITIVE     Leukocytes, UA moderate (  2+)    POCT UA - MICROSCOPIC ONLY     Status: Abnormal   Collection Time    12/11/13  3:57 PM      Result Value Ref Range   WBC, Ur, HPF, POC TNTC     RBC, urine, microscopic TNTC     Bacteria, U Microscopic LOADED     Epithelial cells, urine per micros 1-5      A/P: See problem list  Essential hypertension, benign Elevated to 182/100 initially today. 168/80 on recheck. Did not take lisinopril this morning. Will recheck in 1 week after taking meds, and consider adding med or increasing dose if still elevated.  Dysuria UA consistent with UTI. No signs of pyelonephritis. UTI last month resistant to only augmentin, imipenem, and zosyn. Will treat empirically today with augmentin. Will follow up culture results and also fax results to [facility].    Algis Greenhouse. Jerline Pain, [place] Resident PGY-1 12/11/2013 4:42 PM

## 2013-12-12 ENCOUNTER — Encounter: Payer: Self-pay | Admitting: *Deleted

## 2013-12-13 LAB — URINE CULTURE: Colony Count: >=100000

## 2013-12-15 ENCOUNTER — Telehealth: Payer: Self-pay

## 2013-12-15 ENCOUNTER — Telehealth: Payer: Self-pay | Admitting: Family Medicine

## 2013-12-15 DIAGNOSIS — C689 Malignant neoplasm of urinary organ, unspecified: Secondary | ICD-10-CM | POA: Diagnosis not present

## 2013-12-15 DIAGNOSIS — F17200 Nicotine dependence, unspecified, uncomplicated: Secondary | ICD-10-CM | POA: Diagnosis not present

## 2013-12-15 DIAGNOSIS — C7800 Secondary malignant neoplasm of unspecified lung: Secondary | ICD-10-CM | POA: Diagnosis not present

## 2013-12-15 DIAGNOSIS — C679 Malignant neoplasm of bladder, unspecified: Secondary | ICD-10-CM | POA: Diagnosis not present

## 2013-12-15 DIAGNOSIS — Z923 Personal history of irradiation: Secondary | ICD-10-CM | POA: Diagnosis not present

## 2013-12-15 MED ORDER — NITROFURANTOIN MONOHYD MACRO 100 MG PO CAPS: 100.0000 mg | ORAL_CAPSULE | Freq: Two times a day (BID) | ORAL | Status: DC

## 2013-12-15 NOTE — Telephone Encounter (Signed)
Received culture results. Intermediate to augmentin, called in nitrofurantoin.

## 2013-12-15 NOTE — Telephone Encounter (Signed)
Information given back to pharmacy

## 2013-12-15 NOTE — Telephone Encounter (Signed)
Message copied by Greggory Keen on Mon Dec 15, 2013 11:14 AM ------      Message from: Alonza Bogus D      Created: Mon Dec 15, 2013 10:37 AM      Regarding: RE: med too expensive       The only other option is the nitroglycerin/Rectiv gel that his PCP had given him, which he did not like.  He could try that again but could try the lidocaine gel with that beforehand and see if that makes a difference compared to when he tried to use it before.            Thank you,            Jess            ----- Message -----         From: Virgina Evener, LPN         Sent: 09/21/9355   8:49 AM           To: Laban Emperor. Zehr, PA-C      Subject: med too expensive                                        Diltiazem gel was too expensive. Is there another option for him?       ------

## 2013-12-16 ENCOUNTER — Ambulatory Visit (INDEPENDENT_AMBULATORY_CARE_PROVIDER_SITE_OTHER): Payer: Medicare Other | Admitting: *Deleted

## 2013-12-16 VITALS — BP 150/80

## 2013-12-16 DIAGNOSIS — I10 Essential (primary) hypertension: Secondary | ICD-10-CM

## 2013-12-16 NOTE — Telephone Encounter (Signed)
-----   Message from Dimas Chyle, MD sent at 12/15/2013 4:41 PM ----- Can you please fax his latest urine culture results to [facility] u[facility]: Dr Laureen Abrahams fax: [phone]? Thanks! -Caleb   Faxed. Amiera Herzberg, Salome Spotted

## 2013-12-16 NOTE — Progress Notes (Signed)
   Pt in nurse clinic for blood pressure check.  BP 150/80 manually.  Pt stated he is having headaches everyday around 5 PM; Tylenol usually helps.  Pt advised to schedule an appt with provider to discuss headaches.  Appt 12/18/2013 at 3:30 PM.  Pt denies any other symptoms.  Pt is taking medications as prescribed.  Derl Barrow, RN

## 2013-12-17 DIAGNOSIS — Z8589 Personal history of malignant neoplasm of other organs and systems: Secondary | ICD-10-CM | POA: Diagnosis not present

## 2013-12-17 DIAGNOSIS — Z79899 Other long term (current) drug therapy: Secondary | ICD-10-CM | POA: Diagnosis not present

## 2013-12-17 DIAGNOSIS — C801 Malignant (primary) neoplasm, unspecified: Secondary | ICD-10-CM | POA: Diagnosis not present

## 2013-12-17 DIAGNOSIS — N400 Enlarged prostate without lower urinary tract symptoms: Secondary | ICD-10-CM | POA: Diagnosis not present

## 2013-12-17 DIAGNOSIS — R319 Hematuria, unspecified: Secondary | ICD-10-CM | POA: Diagnosis not present

## 2013-12-17 DIAGNOSIS — Z8711 Personal history of peptic ulcer disease: Secondary | ICD-10-CM | POA: Diagnosis not present

## 2013-12-17 DIAGNOSIS — C3490 Malignant neoplasm of unspecified part of unspecified bronchus or lung: Secondary | ICD-10-CM | POA: Diagnosis not present

## 2013-12-17 DIAGNOSIS — I10 Essential (primary) hypertension: Secondary | ICD-10-CM | POA: Diagnosis not present

## 2013-12-17 DIAGNOSIS — F17200 Nicotine dependence, unspecified, uncomplicated: Secondary | ICD-10-CM | POA: Diagnosis not present

## 2013-12-18 ENCOUNTER — Ambulatory Visit: Payer: Medicare Other | Admitting: Family Medicine

## 2014-01-20 ENCOUNTER — Encounter: Payer: Self-pay | Admitting: Internal Medicine

## 2014-01-20 ENCOUNTER — Ambulatory Visit (INDEPENDENT_AMBULATORY_CARE_PROVIDER_SITE_OTHER): Payer: Medicare Other | Admitting: Internal Medicine

## 2014-01-20 VITALS — BP 148/88 | HR 84 | Ht 65.0 in | Wt 178.0 lb

## 2014-01-20 DIAGNOSIS — K648 Other hemorrhoids: Secondary | ICD-10-CM | POA: Diagnosis not present

## 2014-01-20 MED ORDER — MOVIPREP 100 G PO SOLR: 1.0000 | Freq: Once | ORAL | Status: DC

## 2014-01-20 MED ORDER — HYDROCORTISONE ACETATE 25 MG RE SUPP: 25.0000 mg | Freq: Every day | RECTAL | Status: DC

## 2014-01-20 NOTE — Progress Notes (Signed)
Jack Thornton 01-25-1930 [id number]  Note: This dictation was prepared with Dragon digital system. Any transcriptional errors that result from this procedure are unintentional.   History of Present Illness:  This is an 80 year old Arabic man who does not speak any English who comes with his daughter for follow up of an anal fissure. He saw Janett Billow Zehr,PA-C 5 weeks ago and was given MiraLax and Amitiza as well as diltiazem 2% cream. He has a history of metastatic bladder cancer for which he is status post radiation, chemotherapy and resection. He reports being improved more than 50% but is still having constipation. The conversation is very limited. He is here with an interpreter. Patient says that he is 80 years old but the record shows he was born in [birth year]. He gives history of an H. pylori positive gastric ulcer in January 2011.    Past Medical History  Diagnosis Date  . Prostate cancer   . Hypertension   . ACUT GASTR ULCER W/O MENTION HEMORR PERF/OBST 05/21/2009  . Essential hypertension, benign 05/21/2009  . HELICOBACTER PYLORI INFECTION 06/14/2009  . TOBACCO ABUSE 05/21/2009  . 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 Hematuria 08/04/2011    Patient has had hematuria on UA exam even after treatment for urinary tract infection. Does have history of instrumentation done at [facility] for hyperplastic prostate. If continues would consider sending to urology for further workup of the cystoscopy and  . Pyelonephritis 08/03/2012    ESBL with extended ertapenem treatment  . Bladder cancer   . Anal fissure   . Umbilical hernia     Past Surgical History  Procedure Laterality Date  . Prostate surgery  ~ 2008  . Urethral dilation  ~ 2011  . Prostate surgery    . Bladder neck reconstruction    . Urethral dilation      No Known Allergies  Family history and social history have been reviewed.  Review of Systems: Constipation rectal pain  The remainder of the 10 point ROS is  negative except as outlined in the H&P  Physical Exam: General Appearance Well developed, in no distress Eyes  Non icteric , blind in the right eye HEENT  Non traumatic, normocephalic  Mouth No lesion, tongue papillated, no cheilosis Neck Supple without adenopathy, thyroid not enlarged, no carotid bruits, no JVD Lungs Clear to auscultation bilaterally COR Normal S1, normal S2, regular rhythm, no murmur, quiet precordium Abdomen soft nontender with normoactive bowel sounds Rectal and anoscopic exam reveals normal perianal area. Normal rectal sphincter tone. Large first grade internal hemorrhoids which are edematous but no active bleeding. Stool is Hemoccult negative Extremities  No pedal edema Skin No lesions Neurological Alert and oriented x 3 Psychological Normal mood and affect  Assessment and Plan:   Problem #20 80 year old Arabic man who gives only a limited history due to language and intelect compromise.Marland Kitchen His rectal pain has improved on topical treatment but his exam today confirms presence of large internal hemorrhoids. He continues to have constipation. He has never had a colonoscopy. He is not knowledgeable about his family history. There is a lot of unknown information about his bowel habits and I would feel more comfortable proceeding with a colonoscopy to rule out an obstructing lesion higher up in his colon. I have explained to this to the daughter and to the patient and he agrees to proceed with colonoscopy. We will start him on Anusol-HC suppositories, one at bedtime for treatment of his hemorrhoids.  Delfin Edis 01/20/2014

## 2014-01-20 NOTE — Patient Instructions (Signed)
You have been scheduled for a colonoscopy. Please follow written instructions given to you at your visit today.  Please pick up your prep kit at the pharmacy within the next 1-3 days. If you use inhalers (even only as needed), please bring them with you on the day of your procedure. Your physician has requested that you go to www.startemmi.com and enter the access code given to you at your visit today. This web site gives a general overview about your procedure. However, you should still follow specific instructions given to you by our office regarding your preparation for the procedure.  We have sent the following medications to your pharmacy for you to pick up at your convenience: Anusol [facility] suppositories- insert 1 suppository into rectum every night.  CC:Dr Dimas Chyle

## 2014-01-21 ENCOUNTER — Telehealth: Payer: Self-pay | Admitting: Internal Medicine

## 2014-01-21 NOTE — Telephone Encounter (Signed)
Please get OTC hemorrhoidal suppositories ( Prep H)

## 2014-01-21 NOTE — Telephone Encounter (Signed)
Dr Olevia Perches, would you like him to just get preparation H suppositories OTC?

## 2014-01-21 NOTE — Telephone Encounter (Signed)
Left a message for patient to call back. 

## 2014-01-22 NOTE — Telephone Encounter (Signed)
Spoke with patient's son and gave him Dr. Nichola Sizer recommendation.

## 2014-01-29 DIAGNOSIS — F17200 Nicotine dependence, unspecified, uncomplicated: Secondary | ICD-10-CM | POA: Diagnosis not present

## 2014-01-29 DIAGNOSIS — N2889 Other specified disorders of kidney and ureter: Secondary | ICD-10-CM | POA: Diagnosis not present

## 2014-01-29 DIAGNOSIS — C787 Secondary malignant neoplasm of liver and intrahepatic bile duct: Secondary | ICD-10-CM | POA: Diagnosis not present

## 2014-01-29 DIAGNOSIS — N400 Enlarged prostate without lower urinary tract symptoms: Secondary | ICD-10-CM | POA: Diagnosis not present

## 2014-01-29 DIAGNOSIS — C7800 Secondary malignant neoplasm of unspecified lung: Secondary | ICD-10-CM | POA: Diagnosis not present

## 2014-01-29 DIAGNOSIS — Z5111 Encounter for antineoplastic chemotherapy: Secondary | ICD-10-CM | POA: Diagnosis not present

## 2014-01-29 DIAGNOSIS — R918 Other nonspecific abnormal finding of lung field: Secondary | ICD-10-CM | POA: Diagnosis not present

## 2014-01-29 DIAGNOSIS — N35919 Unspecified urethral stricture, male, unspecified site: Secondary | ICD-10-CM | POA: Diagnosis not present

## 2014-01-29 DIAGNOSIS — I10 Essential (primary) hypertension: Secondary | ICD-10-CM | POA: Diagnosis not present

## 2014-01-29 DIAGNOSIS — R630 Anorexia: Secondary | ICD-10-CM | POA: Diagnosis not present

## 2014-01-29 DIAGNOSIS — C689 Malignant neoplasm of urinary organ, unspecified: Secondary | ICD-10-CM | POA: Diagnosis not present

## 2014-01-29 DIAGNOSIS — C679 Malignant neoplasm of bladder, unspecified: Secondary | ICD-10-CM | POA: Diagnosis not present

## 2014-01-29 DIAGNOSIS — N1330 Unspecified hydronephrosis: Secondary | ICD-10-CM | POA: Diagnosis not present

## 2014-01-29 DIAGNOSIS — N289 Disorder of kidney and ureter, unspecified: Secondary | ICD-10-CM | POA: Diagnosis not present

## 2014-01-29 DIAGNOSIS — N3289 Other specified disorders of bladder: Secondary | ICD-10-CM | POA: Diagnosis not present

## 2014-01-30 ENCOUNTER — Telehealth: Payer: Self-pay | Admitting: Family Medicine

## 2014-01-30 ENCOUNTER — Ambulatory Visit (AMBULATORY_SURGERY_CENTER): Payer: Medicare Other | Admitting: Internal Medicine

## 2014-01-30 ENCOUNTER — Encounter: Payer: Self-pay | Admitting: Internal Medicine

## 2014-01-30 VITALS — BP 159/89 | HR 77 | Temp 97.6°F | Resp 26 | Ht 65.0 in | Wt 178.0 lb

## 2014-01-30 DIAGNOSIS — K6289 Other specified diseases of anus and rectum: Secondary | ICD-10-CM

## 2014-01-30 DIAGNOSIS — K648 Other hemorrhoids: Secondary | ICD-10-CM | POA: Diagnosis not present

## 2014-01-30 DIAGNOSIS — D122 Benign neoplasm of ascending colon: Secondary | ICD-10-CM

## 2014-01-30 DIAGNOSIS — D126 Benign neoplasm of colon, unspecified: Secondary | ICD-10-CM

## 2014-01-30 DIAGNOSIS — D125 Benign neoplasm of sigmoid colon: Secondary | ICD-10-CM

## 2014-01-30 DIAGNOSIS — K649 Unspecified hemorrhoids: Secondary | ICD-10-CM | POA: Diagnosis not present

## 2014-01-30 DIAGNOSIS — I10 Essential (primary) hypertension: Secondary | ICD-10-CM | POA: Diagnosis not present

## 2014-01-30 MED ORDER — SODIUM CHLORIDE 0.9 % IV SOLN: 500.0000 mL | INTRAVENOUS | Status: DC

## 2014-01-30 NOTE — Op Note (Signed)
[place]  [address] [place], [address]   COLONOSCOPY PROCEDURE REPORT  PATIENT: Jack Thornton, Jack Thornton  MR#: [id number] BIRTHDATE: 04/15/1930 , 58  yrs. old GENDER: Male ENDOSCOPIST: Lafayette Dragon, MD REFERRED BY:Dr Sharl Ma PROCEDURE DATE:  01/30/2014 PROCEDURE:   Colonoscopy with cold biopsy polypectomy First Screening Colonoscopy - Avg.  risk and is 50 yrs.  old or older Yes.  Prior Negative Screening - Now for repeat screening. N/A  History of Adenoma - Now for follow-up colonoscopy & has been > or = to 3 yrs.  N/A  Polyps Removed Today? Yes. ASA CLASS:   Class II INDICATIONS:Rectal Bleeding and ,rrectal pain history of anal fissure, Prostate cancer. MEDICATIONS: MAC sedation, administered by CRNA and propofol (Diprivan) 250mg  IV  DESCRIPTION OF PROCEDURE:   After the risks benefits and alternatives of the procedure were thoroughly explained, informed consent was obtained.  A digital rectal exam revealed hemorrhoids. The LB [id number]  endoscope was introduced through the anus and advanced to the cecum, which was identified by both the appendix and ileocecal valve. No adverse events experienced.   The quality of the prep was excellent, using MoviPrep  The instrument was then slowly withdrawn as the colon was fully examined.      COLON FINDINGS: Four diminutive sessile polyps were found in the ascending colon x1 and sigmoid colon. x3  A polypectomy was performed with cold forceps.  The resection was complete and the polyp tissue was completely retrieved.   Mild diverticulosis was noted in the sigmoid colon.   Moderate sized internal hemorrhoids were found.  Retroflexed views revealed no abnormalities. The time to cecum=5 minutes 03 seconds.  Withdrawal time=19 minutes 33 seconds.  The scope was withdrawn and the procedure completed. COMPLICATIONS: There were no complications.  ENDOSCOPIC IMPRESSION: Four diminutive sessile polyps were found in  the ascending colon and sigmoid colon; polypectomy was performed with cold forceps moderate-sized internal hemorrhoids likely source of bleeding. No evidence of anal fissure Mild diverticulosis of the sigmoid colon RECOMMENDATIONS: high fiber diet await pathology report Anusol-HC suppositories at bedtime deep several refills Consider hemorrhoidal band ligation if bleeding and pain continues No recall colonoscopy due to age   eSigned:  Lafayette Dragon, MD 01/30/2014 9:14 AM   cc:   PATIENT NAME:  Jack Thornton, Jack Thornton MR#: [id number]

## 2014-01-30 NOTE — Telephone Encounter (Signed)
Refill request for lisinopril. [facility] [address].

## 2014-01-30 NOTE — Progress Notes (Signed)
Called to room to assist during endoscopic procedure.  Patient ID and intended procedure confirmed with present staff. Received instructions for my participation in the procedure from the performing physician.  

## 2014-01-30 NOTE — Progress Notes (Signed)
Report to PACU, RN, vss, BBS= Clear.  

## 2014-01-30 NOTE — Patient Instructions (Signed)
YOU HAD AN ENDOSCOPIC PROCEDURE TODAY AT THE [facility]: Refer to the procedure report that was given to you for any specific questions about what was found during the examination.  If the procedure report does not answer your questions, please call your gastroenterologist to clarify.  If you requested that your care partner not be given the details of your procedure findings, then the procedure report has been included in a sealed envelope for you to review at your convenience later.  YOU SHOULD EXPECT: Some feelings of bloating in the abdomen. Passage of more gas than usual.  Walking can help get rid of the air that was put into your GI tract during the procedure and reduce the bloating. If you had a lower endoscopy (such as a colonoscopy or flexible sigmoidoscopy) you may notice spotting of blood in your stool or on the toilet paper. If you underwent a bowel prep for your procedure, then you may not have a normal bowel movement for a few days.  DIET: Your first meal following the procedure should be a light meal and then it is ok to progress to your normal diet.  A half-sandwich or bowl of soup is an example of a good first meal.  Heavy or fried foods are harder to digest and may make you feel nauseous or bloated.  Likewise meals heavy in dairy and vegetables can cause extra gas to form and this can also increase the bloating.  Drink plenty of fluids but you should avoid alcoholic beverages for 24 hours.  ACTIVITY: Your care partner should take you home directly after the procedure.  You should plan to take it easy, moving slowly for the rest of the day.  You can resume normal activity the day after the procedure however you should NOT DRIVE or use heavy machinery for 24 hours (because of the sedation medicines used during the test).    SYMPTOMS TO REPORT IMMEDIATELY: A gastroenterologist can be reached at any hour.  During normal business hours, 8:30 AM to 5:00 PM Monday through Friday,  call [phone].  After hours and on weekends, please call the GI answering service at [phone] who will take a message and have the physician on call contact you.   Following lower endoscopy (colonoscopy or flexible sigmoidoscopy):  Excessive amounts of blood in the stool  Significant tenderness or worsening of abdominal pains  Swelling of the abdomen that is new, acute  Fever of 100F or higher    FOLLOW UP: If any biopsies were taken you will be contacted by phone or by letter within the next 1-3 weeks.  Call your gastroenterologist if you have not heard about the biopsies in 3 weeks.  Our staff will call the home number listed on your records the next business day following your procedure to check on you and address any questions or concerns that you may have at that time regarding the information given to you following your procedure. This is a courtesy call and so if there is no answer at the home number and we have not heard from you through the emergency physician on call, we will assume that you have returned to your regular daily activities without incident.  SIGNATURES/CONFIDENTIALITY: You and/or your care partner have signed paperwork which will be entered into your electronic medical record.  These signatures attest to the fact that that the information above on your After Visit Summary has been reviewed and is understood.  Full responsibility of the confidentiality   of this discharge information lies with you and/or your care-partner.  Polyp, hemorrhoid, and high fiber diet information given.  Over the counter anusol suppositories every night.

## 2014-02-02 ENCOUNTER — Telehealth: Payer: Self-pay | Admitting: *Deleted

## 2014-02-02 MED ORDER — LISINOPRIL 20 MG PO TABS: 20.0000 mg | ORAL_TABLET | Freq: Every day | ORAL | Status: DC

## 2014-02-02 NOTE — Telephone Encounter (Signed)
  Follow up Call-  Call back number 01/30/2014  Post procedure Call Back phone  # [phone]  Permission to leave phone message Yes     Patient questions:  Do you have a fever, pain , or abdominal swelling? No. Pain Score  0 *  Have you tolerated food without any problems? Yes.    Have you been able to return to your normal activities? Yes.    Do you have any questions about your discharge instructions: Diet   No. Medications  No. Follow up visit  No.  Do you have questions or concerns about your Care? No.  Actions: * If pain score is 4 or above: No action needed, pain <4.  Information provided via son.

## 2014-02-02 NOTE — Telephone Encounter (Signed)
Sent in prescription.  Algis Greenhouse. Jerline Pain, [place] Resident PGY-1 02/02/2014 1:24 PM

## 2014-02-03 ENCOUNTER — Encounter: Payer: Self-pay | Admitting: Internal Medicine

## 2014-02-04 ENCOUNTER — Encounter: Payer: Self-pay | Admitting: *Deleted

## 2014-02-09 DIAGNOSIS — R918 Other nonspecific abnormal finding of lung field: Secondary | ICD-10-CM | POA: Diagnosis not present

## 2014-02-09 DIAGNOSIS — C689 Malignant neoplasm of urinary organ, unspecified: Secondary | ICD-10-CM | POA: Diagnosis not present

## 2014-02-09 DIAGNOSIS — N35919 Unspecified urethral stricture, male, unspecified site: Secondary | ICD-10-CM | POA: Diagnosis not present

## 2014-02-09 DIAGNOSIS — N390 Urinary tract infection, site not specified: Secondary | ICD-10-CM | POA: Diagnosis not present

## 2014-02-12 DIAGNOSIS — C688 Malignant neoplasm of overlapping sites of urinary organs: Secondary | ICD-10-CM | POA: Diagnosis not present

## 2014-02-13 ENCOUNTER — Emergency Department (HOSPITAL_COMMUNITY)
Admission: EM | Admit: 2014-02-13 | Discharge: 2014-02-13 | Discharge status: Against Medical Advice | Payer: Medicare Other | Attending: Emergency Medicine | Admitting: Emergency Medicine

## 2014-02-13 ENCOUNTER — Encounter (HOSPITAL_COMMUNITY): Payer: Self-pay | Admitting: Emergency Medicine

## 2014-02-13 DIAGNOSIS — F17210 Nicotine dependence, cigarettes, uncomplicated: Secondary | ICD-10-CM | POA: Diagnosis not present

## 2014-02-13 DIAGNOSIS — Z8551 Personal history of malignant neoplasm of bladder: Secondary | ICD-10-CM | POA: Diagnosis not present

## 2014-02-13 DIAGNOSIS — R300 Dysuria: Secondary | ICD-10-CM | POA: Diagnosis present

## 2014-02-13 DIAGNOSIS — Z8719 Personal history of other diseases of the digestive system: Secondary | ICD-10-CM | POA: Insufficient documentation

## 2014-02-13 DIAGNOSIS — Z79899 Other long term (current) drug therapy: Secondary | ICD-10-CM | POA: Insufficient documentation

## 2014-02-13 DIAGNOSIS — Z8619 Personal history of other infectious and parasitic diseases: Secondary | ICD-10-CM | POA: Insufficient documentation

## 2014-02-13 DIAGNOSIS — Z8546 Personal history of malignant neoplasm of prostate: Secondary | ICD-10-CM | POA: Diagnosis not present

## 2014-02-13 DIAGNOSIS — N390 Urinary tract infection, site not specified: Secondary | ICD-10-CM | POA: Diagnosis not present

## 2014-02-13 DIAGNOSIS — C801 Malignant (primary) neoplasm, unspecified: Secondary | ICD-10-CM | POA: Diagnosis not present

## 2014-02-13 DIAGNOSIS — I10 Essential (primary) hypertension: Secondary | ICD-10-CM | POA: Diagnosis not present

## 2014-02-13 DIAGNOSIS — B9689 Other specified bacterial agents as the cause of diseases classified elsewhere: Secondary | ICD-10-CM | POA: Diagnosis not present

## 2014-02-13 DIAGNOSIS — Z720 Tobacco use: Secondary | ICD-10-CM | POA: Insufficient documentation

## 2014-02-13 DIAGNOSIS — C7911 Secondary malignant neoplasm of bladder: Secondary | ICD-10-CM | POA: Diagnosis not present

## 2014-02-13 DIAGNOSIS — R319 Hematuria, unspecified: Secondary | ICD-10-CM | POA: Diagnosis not present

## 2014-02-13 DIAGNOSIS — Z7952 Long term (current) use of systemic steroids: Secondary | ICD-10-CM | POA: Diagnosis not present

## 2014-02-13 DIAGNOSIS — R339 Retention of urine, unspecified: Secondary | ICD-10-CM

## 2014-02-13 DIAGNOSIS — C679 Malignant neoplasm of bladder, unspecified: Secondary | ICD-10-CM | POA: Diagnosis not present

## 2014-02-13 DIAGNOSIS — Z9841 Cataract extraction status, right eye: Secondary | ICD-10-CM | POA: Diagnosis not present

## 2014-02-13 LAB — URINALYSIS, ROUTINE W REFLEX MICROSCOPIC
Glucose, UA: NEGATIVE mg/dL
Ketones, ur: NEGATIVE mg/dL
Nitrite: POSITIVE — AB
Specific Gravity, Urine: 1.01 (ref 1.005–1.030)
UROBILINOGEN UA: 1 mg/dL (ref 0.0–1.0)
pH: 5.5 (ref 5.0–8.0)

## 2014-02-13 LAB — BASIC METABOLIC PANEL
ANION GAP: 11 (ref 5–15)
BUN: 21 mg/dL (ref 6–23)
CHLORIDE: 95 meq/L — AB (ref 96–112)
CO2: 24 meq/L (ref 19–32)
CREATININE: 1.2 mg/dL (ref 0.50–1.35)
Calcium: 9.2 mg/dL (ref 8.4–10.5)
GFR calc Af Amer: 63 mL/min — ABNORMAL LOW (ref >90)
GFR calc non Af Amer: 54 mL/min — ABNORMAL LOW (ref >90)
Glucose, Bld: 116 mg/dL — ABNORMAL HIGH (ref 70–99)
Potassium: 4 mEq/L (ref 3.7–5.3)
SODIUM: 130 meq/L — AB (ref 137–147)

## 2014-02-13 LAB — CBC WITH DIFFERENTIAL/PLATELET
Basophils Absolute: 0 10*3/uL (ref 0.0–0.1)
Basophils Relative: 0 % (ref 0–1)
Eosinophils Absolute: 0.1 10*3/uL (ref 0.0–0.7)
Eosinophils Relative: 2 % (ref 0–5)
HCT: 36.4 % — ABNORMAL LOW (ref 39.0–52.0)
HEMOGLOBIN: 12.8 g/dL — AB (ref 13.0–17.0)
LYMPHS ABS: 0.8 10*3/uL (ref 0.7–4.0)
LYMPHS PCT: 13 % (ref 12–46)
MCH: 33.2 pg (ref 26.0–34.0)
MCHC: 35.2 g/dL (ref 30.0–36.0)
MCV: 94.5 fL (ref 78.0–100.0)
MONO ABS: 0.4 10*3/uL (ref 0.1–1.0)
Monocytes Relative: 7 % (ref 3–12)
Neutro Abs: 4.5 10*3/uL (ref 1.7–7.7)
Neutrophils Relative %: 78 % — ABNORMAL HIGH (ref 43–77)
Platelets: 178 10*3/uL (ref 150–400)
RBC: 3.85 MIL/uL — ABNORMAL LOW (ref 4.22–5.81)
RDW: 12.4 % (ref 11.5–15.5)
WBC: 5.8 10*3/uL (ref 4.0–10.5)

## 2014-02-13 LAB — URINE MICROSCOPIC-ADD ON

## 2014-02-13 MED ORDER — CEFTRIAXONE SODIUM 1 G IJ SOLR
1.0000 g | Freq: Once | INTRAMUSCULAR | Status: AC
  Administered 2014-02-13: 1 g via INTRAVENOUS
  Filled 2014-02-13: qty 10

## 2014-02-13 MED ORDER — LISINOPRIL 20 MG PO TABS
20.0000 mg | ORAL_TABLET | Freq: Once | ORAL | Status: AC
  Administered 2014-02-13: 20 mg via ORAL
  Filled 2014-02-13: qty 1

## 2014-02-13 NOTE — ED Notes (Signed)
Port accessed, sterile technique maintained. Labs drawn.

## 2014-02-13 NOTE — ED Notes (Signed)
IV team paged to send heparin to de access patients port.

## 2014-02-13 NOTE — ED Notes (Signed)
Pt reports dysuria and hematuria that started yesterday. Pt reports when he urinates he feels as though he is not emptying his bladder completely. Pt reports abdominal pain as well. Pt does not speak english, pt speaks arabic, son translating at this time.

## 2014-02-13 NOTE — ED Provider Notes (Signed)
CSN: [id number]     Arrival date & time 02/13/14  0052 History   First MD Initiated Contact with Patient 02/13/14 0104     Chief Complaint  Patient presents with  . Dysuria     (Consider location/radiation/quality/duration/timing/severity/associated sxs/prior Treatment) HPI Comments: Patient with a history of Bladder Cancer and Prostate Cancer presents today with urinary retention, dysuria, and hematuria.  He reports that he began having symptoms earlier today and gradually worsening.  He states that he has been urinating very little today, but was urinating normally yesterday.  He is also complaining of suprapubic abdominal pain.  He denies fever, chills, nausea, vomiting, diarrhea, or constipation.  Last BM was yesterday.  He reports that he is currently followed by Urology at [facility].  His son reports that he completed chemotherapy for the cancer in December of 2014 and that the cancer is currently being treated with some sort of pill.  Blood pressure found to be elevated upon arrival in the ED.  Patient is currently on Lisinopril for the HTN, but his son reports that he is not taking it.  Patient denies headache, vision changes, or chest pain.    The history is provided by the patient.    Past Medical History  Diagnosis Date  . Prostate cancer   . Hypertension   . ACUT GASTR ULCER W/O MENTION HEMORR PERF/OBST 05/21/2009  . Essential hypertension, benign 05/21/2009  . HELICOBACTER PYLORI INFECTION 06/14/2009  . TOBACCO ABUSE 05/21/2009  . 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 Hematuria 08/04/2011    Patient has had hematuria on UA exam even after treatment for urinary tract infection. Does have history of instrumentation done at [facility] for hyperplastic prostate. If continues would consider sending to urology for further workup of the cystoscopy and  . Pyelonephritis 08/03/2012    ESBL with extended ertapenem treatment  . Bladder cancer   . Anal fissure   . Umbilical hernia     Past Surgical History  Procedure Laterality Date  . Prostate surgery  ~ 2008  . Urethral dilation  ~ 2011  . Prostate surgery    . Bladder neck reconstruction    . Urethral dilation     Family History  Problem Relation Age of Onset  . Hypertension      mother and father  . Coronary artery disease      father  . Hypertension Mother   . Hypertension Father   . Heart disease Father   . Colon cancer Neg Hx   . Esophageal cancer Neg Hx   . Rectal cancer Neg Hx   . Stomach cancer Neg Hx    History  Substance Use Topics  . Smoking status: Current Every Day Smoker -- 1.00 packs/day for 50 years    Types: Cigarettes  . Smokeless tobacco: Never Used  . Alcohol Use: No    Review of Systems  All other systems reviewed and are negative.     Allergies  Review of patient's allergies indicates no known allergies.  Home Medications   Prior to Admission medications   Medication Sig Start Date End Date Taking? Authorizing Provider  hydrocortisone (ANUSOL-HC) 25 MG suppository Place 1 suppository (25 mg total) rectally at bedtime. 01/20/14   Lafayette Dragon, MD  lisinopril (PRINIVIL,ZESTRIL) 20 MG tablet Take 1 tablet (20 mg total) by mouth daily. 02/02/14   Dimas Chyle, MD   BP 207/112  Pulse 99  Temp(Src) 98.6 F (37 C) (Oral)  Resp 18  SpO2 100% Physical Exam  Nursing note and vitals reviewed. Constitutional: He appears well-developed and well-nourished.  HENT:  Head: Normocephalic and atraumatic.  Mouth/Throat: Oropharynx is clear and moist.  Neck: Normal range of motion. Neck supple.  Cardiovascular: Normal rate, regular rhythm and normal heart sounds.   Pulmonary/Chest: Effort normal and breath sounds normal.  Abdominal: Soft. Bowel sounds are normal. He exhibits distension. He exhibits no mass. There is tenderness. There is no rebound, no guarding and no CVA tenderness.  Bladder distension on exam Suprapubic tenderness to palpation  Musculoskeletal: Normal range of  motion.  Neurological: He is alert.  Skin: Skin is warm and dry.  Psychiatric: He has a normal mood and affect.    ED Course  Procedures (including critical care time) Labs Review Labs Reviewed  URINALYSIS, ROUTINE W REFLEX MICROSCOPIC    Imaging Review No results found.   EKG Interpretation None     3:40 AM Discussed patient with Family Practice Service.  They report that they will come see the patient in the ED. MDM   Final diagnoses:  None   Patient with a history of Bladder Cancer and Prostate Cancer who is currently followed by [facility] presents today with a chief complaint of urinary retention, dysuria, and hematuria onset earlier today.  UA showing a UTI.  Patient started on Rocephin IV in the ED.  Urine sent for culture.  Post void bladder scan performed and there was 635 cc in the bladder.  Nursing staff attempted to insert Foley Catheter, but were unsuccessful.  Dr. Claudine Mouton than attempted to place a Cudet Catheter, but was unsuccessful.  Discussed patient with [facility] service who was going to admit the patient.  However, the patient and family opted to leave and go immediately to [facility] since his Urologist is there.  Patient stable at time of discharge.    Hyman Bible, PA-C 02/14/14 [phone]

## 2014-02-13 NOTE — ED Notes (Signed)
2 unsuccessful attempts with regular foley 16 fr urinary catheter by RN and nurse tech, one unsuccessful attempt of 16 coude catheter by Dr. Claudine Mouton and then Dr. Claudine Mouton attempted again with an 68fr coude catheter but was unsuccessful. Pt and family stating they want to leave here and take the patient to [facility].

## 2014-02-14 DIAGNOSIS — R339 Retention of urine, unspecified: Secondary | ICD-10-CM | POA: Diagnosis not present

## 2014-02-14 DIAGNOSIS — N359 Urethral stricture, unspecified: Secondary | ICD-10-CM | POA: Diagnosis not present

## 2014-02-14 DIAGNOSIS — C61 Malignant neoplasm of prostate: Secondary | ICD-10-CM | POA: Diagnosis not present

## 2014-02-14 DIAGNOSIS — T839XXA Unspecified complication of genitourinary prosthetic device, implant and graft, initial encounter: Secondary | ICD-10-CM | POA: Diagnosis not present

## 2014-02-14 DIAGNOSIS — T83098D Other mechanical complication of other indwelling urethral catheter, subsequent encounter: Secondary | ICD-10-CM | POA: Diagnosis not present

## 2014-02-14 DIAGNOSIS — C679 Malignant neoplasm of bladder, unspecified: Secondary | ICD-10-CM | POA: Diagnosis not present

## 2014-02-14 LAB — URINE CULTURE
Colony Count: NO GROWTH
Culture: NO GROWTH

## 2014-02-15 NOTE — ED Provider Notes (Signed)
Patient presents for dysuria and urinary retention.  He has h/o bladder and prostate cancer, this has occurred to him before, requiring a foley catheter.  Patient required admission to the hospital, multiple attempts were made at placing a foley catheter by nursing staff and by myself twice without any success.  Family and patient elected to leave AMA to see their urologist at [facility], rather than wait to see urology here in the morning.  Patient's vitals signs remained stable and he was DC AMA from the ED.  Medical screening examination/treatment/procedure(s) were conducted as a shared visit with non-physician practitioner(s) and myself.  I personally evaluated the patient during the encounter.   EKG Interpretation None        Everlene Balls, MD 02/15/14 0008

## 2014-02-19 DIAGNOSIS — R338 Other retention of urine: Secondary | ICD-10-CM | POA: Diagnosis not present

## 2014-02-19 DIAGNOSIS — Z5112 Encounter for antineoplastic immunotherapy: Secondary | ICD-10-CM | POA: Diagnosis not present

## 2014-02-19 DIAGNOSIS — C7800 Secondary malignant neoplasm of unspecified lung: Secondary | ICD-10-CM | POA: Diagnosis not present

## 2014-02-19 DIAGNOSIS — Z79899 Other long term (current) drug therapy: Secondary | ICD-10-CM | POA: Diagnosis not present

## 2014-02-19 DIAGNOSIS — N400 Enlarged prostate without lower urinary tract symptoms: Secondary | ICD-10-CM | POA: Diagnosis not present

## 2014-02-19 DIAGNOSIS — C7982 Secondary malignant neoplasm of genital organs: Secondary | ICD-10-CM | POA: Diagnosis not present

## 2014-02-19 DIAGNOSIS — C772 Secondary and unspecified malignant neoplasm of intra-abdominal lymph nodes: Secondary | ICD-10-CM | POA: Diagnosis not present

## 2014-02-19 DIAGNOSIS — I10 Essential (primary) hypertension: Secondary | ICD-10-CM | POA: Diagnosis not present

## 2014-02-19 DIAGNOSIS — C775 Secondary and unspecified malignant neoplasm of intrapelvic lymph nodes: Secondary | ICD-10-CM | POA: Diagnosis not present

## 2014-02-19 DIAGNOSIS — C688 Malignant neoplasm of overlapping sites of urinary organs: Secondary | ICD-10-CM | POA: Diagnosis not present

## 2014-02-19 DIAGNOSIS — F17210 Nicotine dependence, cigarettes, uncomplicated: Secondary | ICD-10-CM | POA: Diagnosis not present

## 2014-02-26 DIAGNOSIS — R339 Retention of urine, unspecified: Secondary | ICD-10-CM | POA: Diagnosis not present

## 2014-02-26 DIAGNOSIS — C688 Malignant neoplasm of overlapping sites of urinary organs: Secondary | ICD-10-CM | POA: Diagnosis not present

## 2014-03-05 ENCOUNTER — Ambulatory Visit (INDEPENDENT_AMBULATORY_CARE_PROVIDER_SITE_OTHER): Payer: Medicare Other | Admitting: Family Medicine

## 2014-03-05 ENCOUNTER — Encounter: Payer: Self-pay | Admitting: Family Medicine

## 2014-03-05 VITALS — BP 136/74 | HR 81 | Temp 97.5°F | Ht 65.0 in | Wt 163.4 lb

## 2014-03-05 DIAGNOSIS — K6289 Other specified diseases of anus and rectum: Secondary | ICD-10-CM

## 2014-03-05 DIAGNOSIS — K649 Unspecified hemorrhoids: Secondary | ICD-10-CM

## 2014-03-05 MED ORDER — POLYETHYLENE GLYCOL 3350 17 GM/SCOOP PO POWD: 17.0000 g | Freq: Every day | ORAL | Status: AC

## 2014-03-05 MED ORDER — HYDROCORTISONE 2.5 % RE CREA: 1.0000 "application " | TOPICAL_CREAM | Freq: Two times a day (BID) | RECTAL | Status: AC

## 2014-03-05 NOTE — Assessment & Plan Note (Signed)
Patient with obvious small hemorrhoids on exam and pain with palpation No discrete thrombosed hemorrhoids to lance Per GI examination had moderate internal hemorrhoids Will refer to surgery for options and likely banding Given prescription for hydrocortisone cream, he has not had good compliance with suppositories Also encouraged to discuss MiraLAX at length, instructions given to titrate to one stool per day.

## 2014-03-05 NOTE — Progress Notes (Signed)
Patient ID: Jack Thornton, male   DOB: Apr 21, 1930, 80 y.o.   MRN: [id number]   HPI  Patient presents today for rectal pain, difficulty with sitting   Patient does not speak English and interview was performed with the help of [facility] interpreter [phone]  Patient was explained that for several months he's had difficulty sitting down. His son explains that his father will not admit to any difficulty or pain. His son explains that for several months now he's had difficulty sitting down meaning that he'll sit down for one to 3 minutes and then stand up. He cannot even eat dinner because he sits down and immediately stands up for a reason that his father will not met. He's concerned for anxiety or pain.  He states that he has known hemorrhoids and has tried Anusol suppositories but is not compliant with that. He also states that he is noncompliant with rectal cream. He tried MiraLAX one time and stated that it did not help. He is also on sorbitol but is having issues with constipation. Last 5 days he's only had one bowel movement and has sit for long periods with much effort to have a bowel movement.  He had a colonoscopy and was told that he had moderate hemorrhoids.  Smoking status noted ROS: Per HPI  Objective: BP 136/74  Pulse 81  Temp(Src) 97.5 F (36.4 C) (Oral)  Ht 5' 5"  (1.651 m)  Wt 163 lb 6.4 oz (74.118 kg)  BMI 27.19 kg/m2 Gen: NAD, alert, cooperative with exam HEENT: NCAT, white discoloration of his right eye Neuro: Alert and oriented, No gross deficits Rectal: Small firm hemorrhoid at 12:00, pain with penetration of the rectum, normal tone, pain limited exam  Assessment and plan:  Rectal pain Patient with obvious small hemorrhoids on exam and pain with palpation No discrete thrombosed hemorrhoids to lance Per GI examination had moderate internal hemorrhoids Will refer to surgery for options and likely banding Given prescription for hydrocortisone cream, he has not had  good compliance with suppositories Also encouraged to discuss MiraLAX at length, instructions given to titrate to one stool per day.   Orders Placed This Encounter  Procedures  . Ambulatory referral to General Surgery    Referral Priority:  Routine    Referral Type:  Surgical    Referral Reason:  Specialty Services Required    Requested Specialty:  General Surgery    Number of Visits Requested:  1    Meds ordered this encounter  Medications  . hydrocortisone (PROCTOSOL HC) 2.5 % rectal cream    Sig: Place 1 application rectally 2 (two) times daily.    Dispense:  30 g    Refill:  0  . polyethylene glycol powder (GLYCOLAX/MIRALAX) powder    Sig: Take 17 g by mouth daily.    Dispense:  3350 g    Refill:  1

## 2014-03-05 NOTE — Patient Instructions (Signed)
Great to meet you!  He has hemorrhoids, Try the cream, take miralax 1 to 2 times daily to achieve 1 easy bowel movement daily.   Hemorrhoids Hemorrhoids are swollen veins around the rectum or anus. There are two types of hemorrhoids:   Internal hemorrhoids. These occur in the veins just inside the rectum. They may poke through to the outside and become irritated and painful.  External hemorrhoids. These occur in the veins outside the anus and can be felt as a painful swelling or hard lump near the anus. CAUSES  Pregnancy.   Obesity.   Constipation or diarrhea.   Straining to have a bowel movement.   Sitting for long periods on the toilet.  Heavy lifting or other activity that caused you to strain.  Anal intercourse. SYMPTOMS   Pain.   Anal itching or irritation.   Rectal bleeding.   Fecal leakage.   Anal swelling.   One or more lumps around the anus.  DIAGNOSIS  Your caregiver may be able to diagnose hemorrhoids by visual examination. Other examinations or tests that may be performed include:   Examination of the rectal area with a gloved hand (digital rectal exam).   Examination of anal canal using a small tube (scope).   A blood test if you have lost a significant amount of blood.  A test to look inside the colon (sigmoidoscopy or colonoscopy). TREATMENT Most hemorrhoids can be treated at home. However, if symptoms do not seem to be getting better or if you have a lot of rectal bleeding, your caregiver may perform a procedure to help make the hemorrhoids get smaller or remove them completely. Possible treatments include:   Placing a rubber band at the base of the hemorrhoid to cut off the circulation (rubber band ligation).   Injecting a chemical to shrink the hemorrhoid (sclerotherapy).   Using a tool to burn the hemorrhoid (infrared light therapy).   Surgically removing the hemorrhoid (hemorrhoidectomy).   Stapling the hemorrhoid to block  blood flow to the tissue (hemorrhoid stapling).  HOME CARE INSTRUCTIONS   Eat foods with fiber, such as whole grains, beans, nuts, fruits, and vegetables. Ask your doctor about taking products with added fiber in them (fibersupplements).  Increase fluid intake. Drink enough water and fluids to keep your urine clear or pale yellow.   Exercise regularly.   Go to the bathroom when you have the urge to have a bowel movement. Do not wait.   Avoid straining to have bowel movements.   Keep the anal area dry and clean. Use wet toilet paper or moist towelettes after a bowel movement.   Medicated creams and suppositories may be used or applied as directed.   Only take over-the-counter or prescription medicines as directed by your caregiver.   Take warm sitz baths for 15-20 minutes, 3-4 times a day to ease pain and discomfort.   Place ice packs on the hemorrhoids if they are tender and swollen. Using ice packs between sitz baths may be helpful.   Put ice in a plastic bag.   Place a towel between your skin and the bag.   Leave the ice on for 15-20 minutes, 3-4 times a day.   Do not use a donut-shaped pillow or sit on the toilet for long periods. This increases blood pooling and pain.  SEEK MEDICAL CARE IF:  You have increasing pain and swelling that is not controlled by treatment or medicine.  You have uncontrolled bleeding.  You have difficulty or  you are unable to have a bowel movement.  You have pain or inflammation outside the area of the hemorrhoids. MAKE SURE YOU:  Understand these instructions.  Will watch your condition.  Will get help right away if you are not doing well or get worse. Document Released: 04/28/2000 Document Revised: 04/17/2012 Document Reviewed: 03/05/2012 [facility] Patient Information 2015 [place], [place]. This information is not intended to replace advice given to you by your health care provider. Make sure you discuss any questions you have  with your health care provider.

## 2014-03-12 DIAGNOSIS — N359 Urethral stricture, unspecified: Secondary | ICD-10-CM | POA: Diagnosis not present

## 2014-03-12 DIAGNOSIS — N281 Cyst of kidney, acquired: Secondary | ICD-10-CM | POA: Diagnosis not present

## 2014-03-12 DIAGNOSIS — N1330 Unspecified hydronephrosis: Secondary | ICD-10-CM | POA: Diagnosis not present

## 2014-03-12 DIAGNOSIS — Z79899 Other long term (current) drug therapy: Secondary | ICD-10-CM | POA: Diagnosis not present

## 2014-03-12 DIAGNOSIS — N179 Acute kidney failure, unspecified: Secondary | ICD-10-CM | POA: Diagnosis not present

## 2014-03-12 DIAGNOSIS — R55 Syncope and collapse: Secondary | ICD-10-CM | POA: Diagnosis not present

## 2014-03-12 DIAGNOSIS — Z5111 Encounter for antineoplastic chemotherapy: Secondary | ICD-10-CM | POA: Diagnosis not present

## 2014-03-12 DIAGNOSIS — C679 Malignant neoplasm of bladder, unspecified: Secondary | ICD-10-CM | POA: Diagnosis not present

## 2014-03-13 DIAGNOSIS — C679 Malignant neoplasm of bladder, unspecified: Secondary | ICD-10-CM | POA: Diagnosis not present

## 2014-03-13 DIAGNOSIS — K5900 Constipation, unspecified: Secondary | ICD-10-CM | POA: Diagnosis not present

## 2014-03-13 DIAGNOSIS — N1330 Unspecified hydronephrosis: Secondary | ICD-10-CM | POA: Diagnosis not present

## 2014-03-13 DIAGNOSIS — N390 Urinary tract infection, site not specified: Secondary | ICD-10-CM | POA: Diagnosis not present

## 2014-03-13 DIAGNOSIS — N179 Acute kidney failure, unspecified: Secondary | ICD-10-CM | POA: Diagnosis not present

## 2014-03-13 DIAGNOSIS — D62 Acute posthemorrhagic anemia: Secondary | ICD-10-CM | POA: Diagnosis not present

## 2014-03-13 DIAGNOSIS — R809 Proteinuria, unspecified: Secondary | ICD-10-CM | POA: Diagnosis not present

## 2014-03-14 DIAGNOSIS — R918 Other nonspecific abnormal finding of lung field: Secondary | ICD-10-CM | POA: Diagnosis not present

## 2014-03-14 DIAGNOSIS — K5900 Constipation, unspecified: Secondary | ICD-10-CM | POA: Diagnosis not present

## 2014-03-14 DIAGNOSIS — N179 Acute kidney failure, unspecified: Secondary | ICD-10-CM | POA: Diagnosis not present

## 2014-03-14 DIAGNOSIS — C679 Malignant neoplasm of bladder, unspecified: Secondary | ICD-10-CM | POA: Diagnosis not present

## 2014-03-14 DIAGNOSIS — D62 Acute posthemorrhagic anemia: Secondary | ICD-10-CM | POA: Diagnosis not present

## 2014-03-15 DIAGNOSIS — D62 Acute posthemorrhagic anemia: Secondary | ICD-10-CM | POA: Diagnosis not present

## 2014-03-15 DIAGNOSIS — K5900 Constipation, unspecified: Secondary | ICD-10-CM | POA: Diagnosis not present

## 2014-03-15 DIAGNOSIS — N179 Acute kidney failure, unspecified: Secondary | ICD-10-CM | POA: Diagnosis not present

## 2014-03-15 DIAGNOSIS — C679 Malignant neoplasm of bladder, unspecified: Secondary | ICD-10-CM | POA: Diagnosis not present

## 2014-03-16 DIAGNOSIS — C679 Malignant neoplasm of bladder, unspecified: Secondary | ICD-10-CM | POA: Diagnosis not present

## 2014-03-16 DIAGNOSIS — N179 Acute kidney failure, unspecified: Secondary | ICD-10-CM | POA: Diagnosis not present
